# Patient Record
Sex: Male | Born: 1965 | Race: Black or African American | Hispanic: No | Marital: Married | Smoking: Former smoker
Health system: Southern US, Community
[De-identification: ages and names within clinical notes are randomized; demographics above are authoritative.]

## PROBLEM LIST (undated history)

## (undated) DIAGNOSIS — R079 Chest pain, unspecified: Secondary | ICD-10-CM

## (undated) DIAGNOSIS — R0602 Shortness of breath: Secondary | ICD-10-CM

## (undated) DIAGNOSIS — R0600 Dyspnea, unspecified: Secondary | ICD-10-CM

## (undated) DIAGNOSIS — I2510 Atherosclerotic heart disease of native coronary artery without angina pectoris: Secondary | ICD-10-CM

## (undated) DIAGNOSIS — I10 Essential (primary) hypertension: Secondary | ICD-10-CM

## (undated) DIAGNOSIS — R0609 Other forms of dyspnea: Secondary | ICD-10-CM

## (undated) HISTORY — DX: Shortness of breath: R06.02

## (undated) HISTORY — PX: NO PAST SURGERIES: SHX2092

## (undated) HISTORY — DX: Essential (primary) hypertension: I10

## (undated) HISTORY — DX: Dyspnea, unspecified: R06.00

## (undated) HISTORY — DX: Other forms of dyspnea: R06.09

## (undated) HISTORY — DX: Chest pain, unspecified: R07.9

---

## 2020-05-07 ENCOUNTER — Emergency Department (HOSPITAL_COMMUNITY)
Admission: EM | Admit: 2020-05-07 | Discharge: 2020-05-08 | Disposition: A | Discharge status: Home / Self Care | Payer: Self-pay | Attending: Emergency Medicine | Admitting: Emergency Medicine

## 2020-05-07 ENCOUNTER — Encounter (HOSPITAL_COMMUNITY): Payer: Self-pay

## 2020-05-07 ENCOUNTER — Other Ambulatory Visit: Payer: Self-pay

## 2020-05-07 ENCOUNTER — Emergency Department (HOSPITAL_COMMUNITY): Payer: Self-pay

## 2020-05-07 DIAGNOSIS — R079 Chest pain, unspecified: Secondary | ICD-10-CM

## 2020-05-07 DIAGNOSIS — I10 Essential (primary) hypertension: Secondary | ICD-10-CM | POA: Insufficient documentation

## 2020-05-07 DIAGNOSIS — R0789 Other chest pain: Secondary | ICD-10-CM | POA: Diagnosis present

## 2020-05-07 LAB — CBC
HCT: 43.3 % (ref 39.0–52.0)
Hemoglobin: 14.2 g/dL (ref 13.0–17.0)
MCH: 29.3 pg (ref 26.0–34.0)
MCHC: 32.8 g/dL (ref 30.0–36.0)
MCV: 89.3 fL (ref 80.0–100.0)
Platelets: 285 10*3/uL (ref 150–400)
RBC: 4.85 MIL/uL (ref 4.22–5.81)
RDW: 14.2 % (ref 11.5–15.5)
WBC: 10.8 10*3/uL — ABNORMAL HIGH (ref 4.0–10.5)
nRBC: 0 % (ref 0.0–0.2)

## 2020-05-07 LAB — BASIC METABOLIC PANEL
Anion gap: 8 (ref 5–15)
BUN: 15 mg/dL (ref 6–20)
CO2: 25 mmol/L (ref 22–32)
Calcium: 9.5 mg/dL (ref 8.9–10.3)
Chloride: 105 mmol/L (ref 98–111)
Creatinine, Ser: 1.3 mg/dL — ABNORMAL HIGH (ref 0.61–1.24)
GFR, Estimated: >60 mL/min (ref >60)
Glucose, Bld: 99 mg/dL (ref 70–99)
Potassium: 4 mmol/L (ref 3.5–5.1)
Sodium: 138 mmol/L (ref 135–145)

## 2020-05-07 LAB — TROPONIN I (HIGH SENSITIVITY): Troponin I (High Sensitivity): 6 ng/L (ref <18)

## 2020-05-07 MED ORDER — ASPIRIN 81 MG PO CHEW
324.0000 mg | CHEWABLE_TABLET | Freq: Once | ORAL | Status: AC
  Administered 2020-05-08: 324 mg via ORAL
  Filled 2020-05-07: qty 4

## 2020-05-07 MED ORDER — NITROGLYCERIN 0.4 MG SL SUBL
0.4000 mg | SUBLINGUAL_TABLET | Freq: Once | SUBLINGUAL | Status: AC
  Administered 2020-05-08 (×2): 0.4 mg via SUBLINGUAL
  Filled 2020-05-07: qty 1

## 2020-05-07 NOTE — ED Provider Notes (Signed)
0- [facility]-EMERGENCY DEPT Provider Note   CSN: [id number] Arrival date & time: 05/07/20  2202     History Chief Complaint  Patient presents with  . Chest Pain    Jesse Wells is a 55 y.o. male.  HPI He presents for evaluation of chest discomfort, bilateral anterior, that is felt as a "burning," discomfort that is fairly constant over the last 2 weeks.  He also has associated dyspnea on exertion but no orthopnea.  Today he was walking while at work, and noticed that he was sweating.  He did notice the chest discomfort at that time as well.  He has not had this syndrome previously.  He does not take medications for blood pressure.  He does not know his cholesterol level.  He works a job as a Naval architect for [employer].  He denies doing heavy lifting, or exertion, while on the job.  There are no other known modifying factors    History reviewed. No pertinent past medical history.  There are no problems to display for this patient.   History reviewed. No pertinent surgical history.     History reviewed. No pertinent family history.     Home Medications Prior to Admission medications   Not on File    Allergies    Patient has no known allergies.  Review of Systems   Review of Systems  All other systems reviewed and are negative.   Physical Exam Updated Vital Signs BP (!) 173/101   Pulse 75   Temp 98.2 F (36.8 C) (Oral)   Resp 14   SpO2 100%   Physical Exam Vitals and nursing note reviewed.  Constitutional:      General: He is not in acute distress.    Appearance: He is well-developed. He is not ill-appearing, toxic-appearing or diaphoretic.  HENT:     Head: Normocephalic and atraumatic.     Right Ear: External ear normal.     Left Ear: External ear normal.     Nose: No congestion or rhinorrhea.     Mouth/Throat:     Pharynx: No oropharyngeal exudate or posterior oropharyngeal erythema.  Eyes:     Conjunctiva/sclera:  Conjunctivae normal.     Pupils: Pupils are equal, round, and reactive to light.  Neck:     Trachea: Phonation normal.  Cardiovascular:     Rate and Rhythm: Normal rate and regular rhythm.     Heart sounds: Normal heart sounds.  Pulmonary:     Effort: Pulmonary effort is normal.     Breath sounds: Normal breath sounds.  Abdominal:     General: There is no distension.     Palpations: Abdomen is soft.     Tenderness: There is no abdominal tenderness.  Musculoskeletal:        General: Normal range of motion.     Cervical back: Normal range of motion and neck supple.  Skin:    General: Skin is warm and dry.  Neurological:     Mental Status: He is alert and oriented to person, place, and time.     Cranial Nerves: No cranial nerve deficit.     Sensory: No sensory deficit.     Motor: No abnormal muscle tone.     Coordination: Coordination normal.  Psychiatric:        Mood and Affect: Mood normal.        Behavior: Behavior normal.        Thought Content: Thought content normal.  Judgment: Judgment normal.     ED Results / Procedures / Treatments   Labs (all labs ordered are listed, but only abnormal results are displayed) Labs Reviewed  CBC - Abnormal; Notable for the following components:      Result Value   WBC 10.8 (*)    All other components within normal limits  BASIC METABOLIC PANEL  TROPONIN I (HIGH SENSITIVITY)    EKG EKG Interpretation  Date/Time:  Wednesday May 07 2020 22:10:07 EDT Ventricular Rate:  76 PR Interval:  133 QRS Duration: 100 QT Interval:  372 QTC Calculation: 419 R Axis:   30 Text Interpretation: Sinus rhythm No old tracing to compare Confirmed by Mancel Bale [phone]) on 05/07/2020 10:21:57 PM   Radiology DG Chest 2 View  Result Date: 05/07/2020 CLINICAL DATA:  Mid chest pain for 2 weeks. Shortness of breath tonight. EXAM: CHEST - 2 VIEW COMPARISON:  None. FINDINGS: The cardiomediastinal contours are normal. Mild bronchial  thickening. Pulmonary vasculature is normal. No consolidation, pleural effusion, or pneumothorax. No acute osseous abnormalities are seen. IMPRESSION: Mild bronchial thickening which can be seen with asthma or bronchitis. Electronically Signed   By: Narda Rutherford M.D.   On: 05/07/2020 22:31    Procedures Procedures   Medications Ordered in ED Medications - No data to display  ED Course  I have reviewed the triage vital signs and the nursing notes.  Pertinent labs & imaging results that were available during my care of the patient were reviewed by me and considered in my medical decision making (see chart for details).    MDM Rules/Calculators/A&P                           Patient Vitals for the past 24 hrs:  BP Temp Temp src Pulse Resp SpO2  05/07/20 2215 (!) 173/101 -- -- 75 14 100 %  05/07/20 2210 (!) 166/95 98.2 F (36.8 C) Oral 73 18 100 %     Medical Decision Making:  This patient is presenting for evaluation of chest pain, which does require a range of treatment options, and is a complaint that involves a moderate risk of morbidity and mortality. The differential diagnoses include ACS, pneumonia, gastrointestinal distress, reflux, nonspecific pain. I decided to review old records, and in summary middle-aged male presenting for evaluation of chest pain shortness of breath on exertion.  He is not having no medical problems and no prior similar symptoms.  Patient is a smoker. I did not require additional historical information from anyone.  Clinical Laboratory Tests Ordered, included CBC, Metabolic panel and Troponin. Review indicates normal except white count slightly elevated,. Radiologic Tests Ordered, included chest x-ray.  I independently Visualized: Radiologic images, which show no infiltrate or edema, possible bronchitis  Cardiac Monitor Tracing which shows normal sinus rhythm   Critical Interventions-clinical evaluation, laboratory testing, chest x-ray,  observation and reassessment  After These Interventions, the Patient was reevaluated and was found with nonspecified chest discomfort, which he describes as burning pain.  He did have associated shortness of breath and dyspnea on exertion, while he was walking today at work.  He is a cigarette smoker.  He does not have symptoms of bronchitis.  Does not appear fluid overloaded.  CRITICAL CARE-no Performed by: Mancel Bale  Nursing Notes Reviewed/ Care Coordinated Applicable Imaging Reviewed Interpretation of Laboratory Data incorporated into ED treatment  Plan:-Disposition by oncoming provider following return of testing.  Patient does not have a PCP.  He has mild hypertension.  He will need referral to PCP or cardiology for ongoing management.  Final Clinical Impression(s) / ED Diagnoses Final diagnoses:  Nonspecific chest pain  Hypertension, unspecified type    Rx / DC Orders ED Discharge Orders    None       Mancel Bale, MD 05/07/20 2257

## 2020-05-07 NOTE — ED Triage Notes (Signed)
Pt c/o intermittent chest pain x2 weeks. Pt states movement makes it worse. Pt states he has had episodes of diaphoresis and weakness with chest pain. Pt states pain is 4/10 at this moment.

## 2020-05-08 LAB — TROPONIN I (HIGH SENSITIVITY): Troponin I (High Sensitivity): 6 ng/L (ref <18)

## 2020-05-08 MED ORDER — HYDROCHLOROTHIAZIDE 25 MG PO TABS: 25.0000 mg | ORAL_TABLET | Freq: Every day | ORAL | 0 refills | Status: DC

## 2020-05-08 MED ORDER — ASPIRIN 81 MG PO CHEW: 81.0000 mg | CHEWABLE_TABLET | Freq: Every day | ORAL | Status: AC

## 2020-05-08 NOTE — ED Notes (Addendum)
Pt reports no change in chest pain after first nitro administration

## 2020-05-08 NOTE — Discharge Instructions (Addendum)
Please pick up your blood pressure medication at your pharmacy. Take a baby aspirin daily.  Follow up with cardiology for stress testing to assess for angina. Establish care with a primary care provider.  Return to the ED if your chest pain persists and is not relieved with rest or worsens.

## 2020-05-08 NOTE — ED Provider Notes (Signed)
I assumed care of this patient.  Please see previous provider note for further details of Hx, PE.  Briefly patient is a 55 y.o. male who presented intermittent chest pain with exertion. HEART 4. EKG without acute ischemic changes or evidence of pericarditis.  Initial troponin negative.  Labs reassuring without anemia.  No significant electrolyte derangements.  Mild renal sufficiency noted.  Repeat EKG after walking briskly down the hall numerous times without ischemic changes Delta troponin negative and flat.  Given ASA. No improvement with NTG.  Will refer patient to cardiology for outpatient evaluation and management.  The patient appears reasonably screened and/or stabilized for discharge and I doubt any other medical condition or other [facility] requiring further screening, evaluation, or treatment in the ED at this time prior to discharge. Safe for discharge with strict return precautions.  Disposition: Discharge  Condition: Good  I have discussed the results, Dx and Tx plan with the patient/family who expressed understanding and agree(s) with the plan. Discharge instructions discussed at length. The patient/family was given strict return precautions who verbalized understanding of the instructions. No further questions at time of discharge.    ED Discharge Orders         Ordered    hydrochlorothiazide (HYDRODIURIL) 25 MG tablet  Daily        05/08/20 0204    Ambulatory referral to Cardiology        05/08/20 0204    aspirin 81 MG chewable tablet  Daily        05/08/20 0204           Follow Up: [facility] [address] [place] [address] [phone] Call  to schedule an appointment for close follow up for stress testing  Primary care provider  Schedule an appointment as soon as possible for a visit  if you do not have a primary care physician, contact [facility] at [phone] for  referral         Nira Conn, MD 05/08/20 0205

## 2020-05-27 ENCOUNTER — Observation Stay (HOSPITAL_COMMUNITY)
Admission: EM | Admit: 2020-05-27 | Discharge: 2020-05-29 | Disposition: A | Discharge status: Home / Self Care | Payer: 59 | Attending: Internal Medicine | Admitting: Internal Medicine

## 2020-05-27 ENCOUNTER — Emergency Department (HOSPITAL_COMMUNITY): Payer: 59

## 2020-05-27 ENCOUNTER — Other Ambulatory Visit: Payer: Self-pay

## 2020-05-27 DIAGNOSIS — M25532 Pain in left wrist: Secondary | ICD-10-CM | POA: Insufficient documentation

## 2020-05-27 DIAGNOSIS — R079 Chest pain, unspecified: Secondary | ICD-10-CM | POA: Diagnosis present

## 2020-05-27 DIAGNOSIS — I25110 Atherosclerotic heart disease of native coronary artery with unstable angina pectoris: Principal | ICD-10-CM

## 2020-05-27 DIAGNOSIS — I10 Essential (primary) hypertension: Secondary | ICD-10-CM | POA: Diagnosis not present

## 2020-05-27 DIAGNOSIS — Z20822 Contact with and (suspected) exposure to covid-19: Secondary | ICD-10-CM | POA: Insufficient documentation

## 2020-05-27 DIAGNOSIS — Z7982 Long term (current) use of aspirin: Secondary | ICD-10-CM | POA: Insufficient documentation

## 2020-05-27 DIAGNOSIS — Z79899 Other long term (current) drug therapy: Secondary | ICD-10-CM | POA: Insufficient documentation

## 2020-05-27 DIAGNOSIS — R072 Precordial pain: Secondary | ICD-10-CM

## 2020-05-27 DIAGNOSIS — Z955 Presence of coronary angioplasty implant and graft: Secondary | ICD-10-CM

## 2020-05-27 DIAGNOSIS — F17210 Nicotine dependence, cigarettes, uncomplicated: Secondary | ICD-10-CM | POA: Diagnosis not present

## 2020-05-27 LAB — COMPREHENSIVE METABOLIC PANEL
ALT: 19 U/L (ref 0–44)
AST: 20 U/L (ref 15–41)
Albumin: 3.8 g/dL (ref 3.5–5.0)
Alkaline Phosphatase: 57 U/L (ref 38–126)
Anion gap: 5 (ref 5–15)
BUN: 12 mg/dL (ref 6–20)
CO2: 29 mmol/L (ref 22–32)
Calcium: 9.4 mg/dL (ref 8.9–10.3)
Chloride: 102 mmol/L (ref 98–111)
Creatinine, Ser: 1.19 mg/dL (ref 0.61–1.24)
GFR, Estimated: >60 mL/min (ref >60)
Glucose, Bld: 99 mg/dL (ref 70–99)
Potassium: 4 mmol/L (ref 3.5–5.1)
Sodium: 136 mmol/L (ref 135–145)
Total Bilirubin: 0.6 mg/dL (ref 0.3–1.2)
Total Protein: 7.2 g/dL (ref 6.5–8.1)

## 2020-05-27 LAB — CBC WITH DIFFERENTIAL/PLATELET
Abs Immature Granulocytes: 0.03 10*3/uL (ref 0.00–0.07)
Basophils Absolute: 0.1 10*3/uL (ref 0.0–0.1)
Basophils Relative: 1 %
Eosinophils Absolute: 0.3 10*3/uL (ref 0.0–0.5)
Eosinophils Relative: 4 %
HCT: 44.8 % (ref 39.0–52.0)
Hemoglobin: 14.7 g/dL (ref 13.0–17.0)
Immature Granulocytes: 0 %
Lymphocytes Relative: 28 %
Lymphs Abs: 2.4 10*3/uL (ref 0.7–4.0)
MCH: 29.1 pg (ref 26.0–34.0)
MCHC: 32.8 g/dL (ref 30.0–36.0)
MCV: 88.7 fL (ref 80.0–100.0)
Monocytes Absolute: 0.8 10*3/uL (ref 0.1–1.0)
Monocytes Relative: 9 %
Neutro Abs: 5.1 10*3/uL (ref 1.7–7.7)
Neutrophils Relative %: 58 %
Platelets: 289 10*3/uL (ref 150–400)
RBC: 5.05 MIL/uL (ref 4.22–5.81)
RDW: 13.8 % (ref 11.5–15.5)
WBC: 8.7 10*3/uL (ref 4.0–10.5)
nRBC: 0 % (ref 0.0–0.2)

## 2020-05-27 LAB — TROPONIN I (HIGH SENSITIVITY)
Troponin I (High Sensitivity): 6 ng/L (ref <18)
Troponin I (High Sensitivity): 6 ng/L (ref <18)

## 2020-05-27 LAB — LIPASE, BLOOD: Lipase: 34 U/L (ref 11–51)

## 2020-05-27 MED ORDER — NITROGLYCERIN 2 % TD OINT
1.0000 [in_us] | TOPICAL_OINTMENT | Freq: Four times a day (QID) | TRANSDERMAL | Status: DC
  Administered 2020-05-27: 1 [in_us] via TOPICAL
  Filled 2020-05-27: qty 1

## 2020-05-27 MED ORDER — OXYCODONE-ACETAMINOPHEN 5-325 MG PO TABS: 1.0000 | ORAL_TABLET | Freq: Four times a day (QID) | ORAL | 0 refills | Status: DC | PRN

## 2020-05-27 MED ORDER — FAMOTIDINE 20 MG PO TABS
40.0000 mg | ORAL_TABLET | Freq: Once | ORAL | Status: AC
  Administered 2020-05-27: 40 mg via ORAL
  Filled 2020-05-27: qty 2

## 2020-05-27 MED ORDER — ONDANSETRON HCL 4 MG/2ML IJ SOLN
4.0000 mg | Freq: Four times a day (QID) | INTRAMUSCULAR | Status: DC | PRN
Start: 2020-05-27 — End: 2020-05-29

## 2020-05-27 MED ORDER — ACETAMINOPHEN 325 MG PO TABS: 650.0000 mg | ORAL_TABLET | ORAL | Status: DC | PRN

## 2020-05-27 MED ORDER — ASPIRIN 81 MG PO CHEW
81.0000 mg | CHEWABLE_TABLET | Freq: Every day | ORAL | Status: DC
  Administered 2020-05-28 – 2020-05-29 (×2): 81 mg via ORAL
  Filled 2020-05-27 (×2): qty 1

## 2020-05-27 MED ORDER — NAPROXEN 375 MG PO TABS: 375.0000 mg | ORAL_TABLET | Freq: Two times a day (BID) | ORAL | 0 refills | Status: DC

## 2020-05-27 MED ORDER — ASPIRIN 81 MG PO CHEW
324.0000 mg | CHEWABLE_TABLET | Freq: Once | ORAL | Status: AC
  Administered 2020-05-27: 324 mg via ORAL
  Filled 2020-05-27: qty 4

## 2020-05-27 MED ORDER — HYDROCHLOROTHIAZIDE 25 MG PO TABS: 25.0000 mg | ORAL_TABLET | Freq: Every day | ORAL | Status: DC

## 2020-05-27 MED ORDER — ENOXAPARIN SODIUM 40 MG/0.4ML IJ SOSY
40.0000 mg | PREFILLED_SYRINGE | INTRAMUSCULAR | Status: DC
  Administered 2020-05-28 (×2): 40 mg via SUBCUTANEOUS
  Filled 2020-05-27 (×2): qty 0.4

## 2020-05-27 NOTE — ED Provider Notes (Deleted)
[facility] EMERGENCY DEPARTMENT Provider Note   CSN: [id number] Arrival date & time: 05/27/20  1456     History Chief Complaint  Patient presents with  . Chest Pain    Jesse Wells is a 55 y.o. male.  HPI Patient has had problems with her left wrist and is seen by the hand specialist Dr. Maisie Fus.  She reports she is had a prior surgery with plating done in the wrist.  They have been monitoring this and evaluating it at the hand specialist.  She reports she had an MRI and there were no acute findings identified.  Because she was having ongoing problems with pain, a hard cast was applied 5 days ago.  She reports yesterday it started to get really painful and this morning it was excruciatingly painful in the cast.  She went to the office and they remove the cast.  She reports she wanted to go home and just try Tylenol for pain control.  She reports it was not effective at all and she is in severe pain.  She reports he had a lot of pain along her thumb in her palm.  Sometimes the fingers feel little tingly.  She reports the wrist is extremely stiff and painful to move now.  She reports previously before going into the cast there was pain but it was not stiff like it is now.    Past Medical History:  Diagnosis Date  . Chest pain   . Hypertension     There are no problems to display for this patient.   Past Surgical History:  Procedure Laterality Date  . NO PAST SURGERIES         No family history on file.     Home Medications Prior to Admission medications   Medication Sig Start Date End Date Taking? Authorizing Provider  naproxen (NAPROSYN) 375 MG tablet Take 1 tablet (375 mg total) by mouth 2 (two) times daily. 05/27/20  Yes Arby Barrette, MD  oxyCODONE-acetaminophen (PERCOCET) 5-325 MG tablet Take 1-2 tablets by mouth every 6 (six) hours as needed for moderate pain. 05/27/20  Yes Arby Barrette, MD  aspirin 81 MG chewable tablet Chew 1 tablet (81 mg  total) by mouth daily. 05/08/20   Nira Conn, MD  hydrochlorothiazide (HYDRODIURIL) 25 MG tablet Take 1 tablet (25 mg total) by mouth daily. 05/08/20   Nira Conn, MD    Allergies    Patient has no known allergies.  Review of Systems   Review of Systems Constitutional: No fevers no chills Aspiratory: No cough no shortness of breath Physical Exam Updated Vital Signs BP (!) 154/98   Pulse 62   Temp 97.8 F (36.6 C) (Oral)   Resp 20   SpO2 100%   Physical Exam Constitutional:      Comments: Alert nontoxic.  Mental status clear.  No respiratory distress.  Patient appears very uncomfortable.  Eyes:     Extraocular Movements: Extraocular movements intact.  Pulmonary:     Effort: Pulmonary effort is normal.  Musculoskeletal:     Comments: The left wrist is not swollen or erythematous.  Patient has a very well-healed scar on the volar forearm.  He is holding the fingers slightly flexed.  It is painful for her to extend them completely.  There is no redness or swelling to the fingers palmar hand.  Pulses 2+ and strong.  Cap refill less than 2 seconds.  No swelling of the forearm or the elbow.  Skin:  General: Skin is warm and dry.  Neurological:     General: No focal deficit present.     Mental Status: He is oriented to person, place, and time.     Coordination: Coordination normal.     ED Results / Procedures / Treatments   Labs (all labs ordered are listed, but only abnormal results are displayed) Labs Reviewed  COMPREHENSIVE METABOLIC PANEL  LIPASE, BLOOD  CBC WITH DIFFERENTIAL/PLATELET  TROPONIN I (HIGH SENSITIVITY)  TROPONIN I (HIGH SENSITIVITY)    EKG EKG Interpretation  Date/Time:  Tuesday May 27 2020 15:33:28 EDT Ventricular Rate:  68 PR Interval:  138 QRS Duration: 92 QT Interval:  384 QTC Calculation: 408 R Axis:   -12 Text Interpretation: Normal sinus rhythm Normal ECG normal, no sig changefrom previous Confirmed by Arby Barrette [phone]) on 05/27/2020 9:05:05 PM   Radiology DG Chest 2 View  Result Date: 05/27/2020 CLINICAL DATA:  Chest pain EXAM: CHEST - 2 VIEW COMPARISON:  05/07/2020 FINDINGS: Mild cardiomegaly. No focal opacity, pleural effusion or pneumothorax. IMPRESSION: No active cardiopulmonary disease. Electronically Signed   By: Jasmine Pang M.D.   On: 05/27/2020 16:16    Procedures Procedures   Medications Ordered in ED Medications - No data to display  ED Course  I have reviewed the triage vital signs and the nursing notes.  Pertinent labs & imaging results that were available during my care of the patient were reviewed by me and considered in my medical decision making (see chart for details).    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  Is being managed by hand surgery.  She had a recent MRI and no signs of any acute findings.  Today the wrist does not appear infected.  There is no erythema or swelling.  The pain dramatically escalated after being in a hard splint.  At this time I suspect this is neuralgia.  Patient now is very reluctant to move the wrist due to exacerbation of pain.  I do not think that there is any additional imaging tonight that will be helpful.  Plan will be to assist with pain control.  Patient request Toradol.  We will give a Toradol shot and 2 Percocet tablets.  Patient will continue to follow-up with a hand specialist. Final Clinical Impression(s) / ED Diagnoses Final diagnoses:  Neuralgia  Left wrist pain    Rx / DC Orders ED Discharge Orders         Ordered    naproxen (NAPROSYN) 375 MG tablet  2 times daily        05/27/20 2123    oxyCODONE-acetaminophen (PERCOCET) 5-325 MG tablet  Every 6 hours PRN        05/27/20 2123           Arby Barrette, MD 05/27/20 2123

## 2020-05-27 NOTE — ED Provider Notes (Signed)
Emergency Medicine Provider Triage Evaluation Note  Jesse Wells , a 55 y.o. male  was evaluated in triage.  Pt complains of chest pain for over a month, he says it is a little worse today and he had a little tingling iin his right arm and got sweaty.  He has a appointment with cardiology but not until June 1st. .  Review of Systems  Positive: Chest pain, right arm tingling, shob when pain is bad Negative: fever  Physical Exam  BP (!) 148/90 (BP Location: Right Arm)   Pulse 66   Temp 98.1 F (36.7 C) (Oral)   Resp 19   SpO2 100%  Gen:   Awake, no distress   Resp:  Normal effort  MSK:   Moves extremities without difficulty  Other:  RRR, no [facility]  Medical Decision Making  Medically screening exam initiated at 3:44 PM.  Appropriate orders placed.  Carlson Belland was informed that the remainder of the evaluation will be completed by another provider, this initial triage assessment does not replace that evaluation, and the importance of remaining in the ED until their evaluation is complete.     Cristina Gong, PA-C 05/27/20 1547    Little, Ambrose Finland, MD 05/30/20 [phone]

## 2020-05-27 NOTE — ED Provider Notes (Incomplete Revision)
[facility] EMERGENCY DEPARTMENT Provider Note   CSN: [id number] Arrival date & time: 05/27/20  1456     History Chief Complaint  Patient presents with  . Chest Pain    Jesse Wells is a 55 y.o. male.  HPI   Past Medical History:  Diagnosis Date  . Chest pain   . Hypertension     There are no problems to display for this patient.   Past Surgical History:  Procedure Laterality Date  . NO PAST SURGERIES         No family history on file.     Home Medications Prior to Admission medications   Medication Sig Start Date End Date Taking? Authorizing Provider  aspirin 81 MG chewable tablet Chew 1 tablet (81 mg total) by mouth daily. 05/08/20   Nira Conn, MD  hydrochlorothiazide (HYDRODIURIL) 25 MG tablet Take 1 tablet (25 mg total) by mouth daily. 05/08/20   Nira Conn, MD    Allergies    Patient has no known allergies.  Review of Systems   Review of Systems  Physical Exam Updated Vital Signs BP 138/85   Pulse 62   Temp 97.8 F (36.6 C) (Oral)   Resp 16   SpO2 99%    ED Results / Procedures / Treatments   Labs (all labs ordered are listed, but only abnormal results are displayed) Labs Reviewed  COMPREHENSIVE METABOLIC PANEL  LIPASE, BLOOD  CBC WITH DIFFERENTIAL/PLATELET  TROPONIN I (HIGH SENSITIVITY)  TROPONIN I (HIGH SENSITIVITY)    EKG EKG Interpretation  Date/Time:  Tuesday May 27 2020 15:33:28 EDT Ventricular Rate:  68 PR Interval:  138 QRS Duration: 92 QT Interval:  384 QTC Calculation: 408 R Axis:   -12 Text Interpretation: Normal sinus rhythm Normal ECG normal, no sig changefrom previous Confirmed by Arby Barrette [phone]) on 05/27/2020 9:05:05 PM   Radiology DG Chest 2 View  Result Date: 05/27/2020 CLINICAL DATA:  Chest pain EXAM: CHEST - 2 VIEW COMPARISON:  05/07/2020 FINDINGS: Mild cardiomegaly. No focal opacity, pleural effusion or pneumothorax. IMPRESSION: No active cardiopulmonary  disease. Electronically Signed   By: Jasmine Pang M.D.   On: 05/27/2020 16:16    Procedures Procedures   Medications Ordered in ED Medications  aspirin chewable tablet 324 mg (has no administration in time range)  nitroGLYCERIN (NITROGLYN) 2 % ointment 1 inch (has no administration in time range)  famotidine (PEPCID) tablet 40 mg (has no administration in time range)    ED Course  I have reviewed the triage vital signs and the nursing notes.  Pertinent labs & imaging results that were available during my care of the patient were reviewed by me and considered in my medical decision making (see chart for details).  Clinical Course as of 05/27/20 2316  Tue May 27, 2020  2311 Consult: Reviewed with [facility] cardiology Dr. Thelma Barge.  Recommends admission to medical service and stress test in a.m. [MP]    Clinical Course User Index [MP] Arby Barrette, MD   MDM Rules/Calculators/A&P                           Final Clinical Impression(s) / ED Diagnoses Final diagnoses:  Precordial pain  Exertional chest pain    Rx / DC Orders ED Discharge Orders         Ordered    naproxen (NAPROSYN) 375 MG tablet  2 times daily,   Status:  Discontinued  05/27/20 2123    oxyCODONE-acetaminophen (PERCOCET) 5-325 MG tablet  Every 6 hours PRN,   Status:  Discontinued        05/27/20 2123           Arby Barrette, MD 05/27/20 2123

## 2020-05-27 NOTE — ED Triage Notes (Addendum)
Pt reports intermittent chest pain x 1 month. Pt report pain is brought on in the morning where he becomes diaphoretic, and SOB. Pt states was seen here a couple of weeks ago and was referred to an cardiologist however pt reports his appointment is not until June and states worsening chest pain today

## 2020-05-28 ENCOUNTER — Encounter (HOSPITAL_COMMUNITY): Payer: Self-pay | Admitting: Internal Medicine

## 2020-05-28 ENCOUNTER — Encounter (HOSPITAL_COMMUNITY)
Admission: EM | Disposition: A | Discharge status: Home / Self Care | Payer: Self-pay | Source: Home / Self Care | Attending: Emergency Medicine

## 2020-05-28 ENCOUNTER — Observation Stay (HOSPITAL_BASED_OUTPATIENT_CLINIC_OR_DEPARTMENT_OTHER): Payer: 59

## 2020-05-28 DIAGNOSIS — R079 Chest pain, unspecified: Secondary | ICD-10-CM

## 2020-05-28 DIAGNOSIS — I25110 Atherosclerotic heart disease of native coronary artery with unstable angina pectoris: Secondary | ICD-10-CM

## 2020-05-28 DIAGNOSIS — I10 Essential (primary) hypertension: Secondary | ICD-10-CM | POA: Diagnosis present

## 2020-05-28 HISTORY — PX: CORONARY STENT INTERVENTION: CATH118234

## 2020-05-28 HISTORY — PX: LEFT HEART CATH AND CORONARY ANGIOGRAPHY: CATH118249

## 2020-05-28 LAB — ECHOCARDIOGRAM COMPLETE
AR max vel: 2.76 cm2
AV Area VTI: 2.81 cm2
AV Area mean vel: 2.82 cm2
AV Mean grad: 3 mmHg
AV Peak grad: 5.1 mmHg
Ao pk vel: 1.13 m/s
Area-P 1/2: 4.89 cm2
Height: 64 in
S' Lateral: 3.2 cm
Weight: 3463.87 oz

## 2020-05-28 LAB — LIPID PANEL
Cholesterol: 153 mg/dL (ref 0–200)
HDL: 40 mg/dL — ABNORMAL LOW (ref >40)
LDL Cholesterol: 100 mg/dL — ABNORMAL HIGH (ref 0–99)
Total CHOL/HDL Ratio: 3.8 RATIO
Triglycerides: 65 mg/dL (ref <150)
VLDL: 13 mg/dL (ref 0–40)

## 2020-05-28 LAB — HEMOGLOBIN A1C
Hgb A1c MFr Bld: 5.6 % (ref 4.8–5.6)
Mean Plasma Glucose: 114.02 mg/dL

## 2020-05-28 LAB — RESP PANEL BY RT-PCR (FLU A&B, COVID) ARPGX2
Influenza A by PCR: NEGATIVE
Influenza B by PCR: NEGATIVE
SARS Coronavirus 2 by RT PCR: NEGATIVE

## 2020-05-28 LAB — POCT ACTIVATED CLOTTING TIME: Activated Clotting Time: 339 seconds

## 2020-05-28 LAB — HIV ANTIBODY (ROUTINE TESTING W REFLEX): HIV Screen 4th Generation wRfx: NONREACTIVE

## 2020-05-28 LAB — MRSA PCR SCREENING: MRSA by PCR: POSITIVE — AB

## 2020-05-28 SURGERY — LEFT HEART CATH AND CORONARY ANGIOGRAPHY
Anesthesia: LOCAL

## 2020-05-28 MED ORDER — SODIUM CHLORIDE 0.9% FLUSH: 3.0000 mL | INTRAVENOUS | Status: DC | PRN

## 2020-05-28 MED ORDER — LOSARTAN POTASSIUM 25 MG PO TABS
25.0000 mg | ORAL_TABLET | Freq: Every day | ORAL | Status: DC
  Administered 2020-05-29: 25 mg via ORAL
  Filled 2020-05-28: qty 1

## 2020-05-28 MED ORDER — IOHEXOL 350 MG/ML SOLN
INTRAVENOUS | Status: DC | PRN
  Administered 2020-05-28: 60 mL

## 2020-05-28 MED ORDER — MIDAZOLAM HCL 2 MG/2ML IJ SOLN
INTRAMUSCULAR | Status: DC | PRN
Start: 2020-05-28 — End: 2020-05-28
  Administered 2020-05-28: 2 mg via INTRAVENOUS
  Administered 2020-05-28: 1 mg via INTRAVENOUS

## 2020-05-28 MED ORDER — MUPIROCIN 2 % EX OINT
1.0000 "application " | TOPICAL_OINTMENT | Freq: Two times a day (BID) | CUTANEOUS | Status: DC
  Administered 2020-05-28 – 2020-05-29 (×3): 1 via NASAL
  Filled 2020-05-28: qty 22

## 2020-05-28 MED ORDER — METOPROLOL SUCCINATE ER 25 MG PO TB24
12.5000 mg | ORAL_TABLET | Freq: Every day | ORAL | Status: DC
  Administered 2020-05-29: 12.5 mg via ORAL
  Filled 2020-05-28: qty 1

## 2020-05-28 MED ORDER — HEPARIN SODIUM (PORCINE) 1000 UNIT/ML IJ SOLN
INTRAMUSCULAR | Status: DC | PRN
  Administered 2020-05-28 (×2): 5000 [IU] via INTRAVENOUS

## 2020-05-28 MED ORDER — MIDAZOLAM HCL 2 MG/2ML IJ SOLN
INTRAMUSCULAR | Status: AC
  Filled 2020-05-28: qty 2

## 2020-05-28 MED ORDER — CHLORHEXIDINE GLUCONATE CLOTH 2 % EX PADS
6.0000 | MEDICATED_PAD | Freq: Every day | CUTANEOUS | Status: DC
  Administered 2020-05-28 – 2020-05-29 (×2): 6 via TOPICAL

## 2020-05-28 MED ORDER — ROSUVASTATIN CALCIUM 20 MG PO TABS
20.0000 mg | ORAL_TABLET | Freq: Every day | ORAL | Status: DC
  Administered 2020-05-29: 20 mg via ORAL
  Filled 2020-05-28: qty 1

## 2020-05-28 MED ORDER — ASPIRIN 81 MG PO CHEW: 81.0000 mg | CHEWABLE_TABLET | ORAL | Status: DC

## 2020-05-28 MED ORDER — LABETALOL HCL 5 MG/ML IV SOLN: 10.0000 mg | INTRAVENOUS | Status: AC | PRN

## 2020-05-28 MED ORDER — SODIUM CHLORIDE 0.9 % WEIGHT BASED INFUSION
1.0000 mL/kg/h | INTRAVENOUS | Status: AC
  Administered 2020-05-29: 1 mL/kg/h via INTRAVENOUS

## 2020-05-28 MED ORDER — CLOPIDOGREL BISULFATE 300 MG PO TABS
ORAL_TABLET | ORAL | Status: AC
  Filled 2020-05-28: qty 1

## 2020-05-28 MED ORDER — NITROGLYCERIN 2 % TD OINT
1.0000 [in_us] | TOPICAL_OINTMENT | Freq: Four times a day (QID) | TRANSDERMAL | Status: DC
  Administered 2020-05-28 – 2020-05-29 (×4): 1 [in_us] via TOPICAL
  Filled 2020-05-28: qty 30

## 2020-05-28 MED ORDER — FENTANYL CITRATE (PF) 100 MCG/2ML IJ SOLN
INTRAMUSCULAR | Status: AC
  Filled 2020-05-28: qty 2

## 2020-05-28 MED ORDER — VERAPAMIL HCL 2.5 MG/ML IV SOLN
INTRAVENOUS | Status: AC
  Filled 2020-05-28: qty 2

## 2020-05-28 MED ORDER — NITROGLYCERIN 1 MG/10 ML FOR IR/CATH LAB
INTRA_ARTERIAL | Status: DC | PRN
  Administered 2020-05-28: 150 ug via INTRACORONARY

## 2020-05-28 MED ORDER — FAMOTIDINE IN NACL 20-0.9 MG/50ML-% IV SOLN
INTRAVENOUS | Status: AC | PRN
  Administered 2020-05-28: 20 mg via INTRAVENOUS

## 2020-05-28 MED ORDER — SODIUM CHLORIDE 0.9 % IV SOLN: 250.0000 mL | INTRAVENOUS | Status: DC | PRN

## 2020-05-28 MED ORDER — NICOTINE 14 MG/24HR TD PT24
14.0000 mg | MEDICATED_PATCH | Freq: Every day | TRANSDERMAL | Status: DC
  Administered 2020-05-28 – 2020-05-29 (×2): 14 mg via TRANSDERMAL
  Filled 2020-05-28 (×2): qty 1

## 2020-05-28 MED ORDER — FAMOTIDINE IN NACL 20-0.9 MG/50ML-% IV SOLN
INTRAVENOUS | Status: AC
  Filled 2020-05-28: qty 50

## 2020-05-28 MED ORDER — SODIUM CHLORIDE 0.9% FLUSH
3.0000 mL | Freq: Two times a day (BID) | INTRAVENOUS | Status: DC
  Administered 2020-05-29 (×2): 3 mL via INTRAVENOUS

## 2020-05-28 MED ORDER — HEPARIN (PORCINE) IN NACL 1000-0.9 UT/500ML-% IV SOLN
INTRAVENOUS | Status: AC
  Filled 2020-05-28: qty 1000

## 2020-05-28 MED ORDER — VERAPAMIL HCL 2.5 MG/ML IV SOLN
INTRAVENOUS | Status: DC | PRN
  Administered 2020-05-28: 10 mL via INTRA_ARTERIAL

## 2020-05-28 MED ORDER — HYDRALAZINE HCL 20 MG/ML IJ SOLN: 10.0000 mg | INTRAMUSCULAR | Status: AC | PRN

## 2020-05-28 MED ORDER — FENTANYL CITRATE (PF) 100 MCG/2ML IJ SOLN
INTRAMUSCULAR | Status: DC | PRN
  Administered 2020-05-28 (×2): 25 ug via INTRAVENOUS

## 2020-05-28 MED ORDER — HEPARIN (PORCINE) IN NACL 1000-0.9 UT/500ML-% IV SOLN
INTRAVENOUS | Status: DC | PRN
  Administered 2020-05-28 (×4): 500 mL

## 2020-05-28 MED ORDER — CLOPIDOGREL BISULFATE 75 MG PO TABS
75.0000 mg | ORAL_TABLET | Freq: Every day | ORAL | Status: DC
  Filled 2020-05-28: qty 1

## 2020-05-28 MED ORDER — CLOPIDOGREL BISULFATE 300 MG PO TABS
ORAL_TABLET | ORAL | Status: DC | PRN
  Administered 2020-05-28: 600 mg via ORAL

## 2020-05-28 MED ORDER — LIDOCAINE HCL (PF) 1 % IJ SOLN
INTRAMUSCULAR | Status: AC
  Filled 2020-05-28: qty 30

## 2020-05-28 MED ORDER — SODIUM CHLORIDE 0.9 % IV SOLN: INTRAVENOUS | Status: DC

## 2020-05-28 MED ORDER — SODIUM CHLORIDE 0.9% FLUSH
3.0000 mL | Freq: Two times a day (BID) | INTRAVENOUS | Status: DC
  Administered 2020-05-28: 3 mL via INTRAVENOUS

## 2020-05-28 MED ORDER — LIDOCAINE HCL (PF) 1 % IJ SOLN
INTRAMUSCULAR | Status: DC | PRN
  Administered 2020-05-28: 2 mL
  Administered 2020-05-28: 30 mL

## 2020-05-28 MED ORDER — NITROGLYCERIN 1 MG/10 ML FOR IR/CATH LAB
INTRA_ARTERIAL | Status: AC
  Filled 2020-05-28: qty 10

## 2020-05-28 MED ORDER — HEPARIN SODIUM (PORCINE) 1000 UNIT/ML IJ SOLN
INTRAMUSCULAR | Status: AC
  Filled 2020-05-28: qty 1

## 2020-05-28 MED ORDER — CLOPIDOGREL BISULFATE 300 MG PO TABS
ORAL_TABLET | ORAL | Status: AC
  Filled 2020-05-28: qty 2

## 2020-05-28 SURGICAL SUPPLY — 18 items
BALLN  ~~LOC~~ SAPPHIRE 4.5X15 (BALLOONS) ×1
BALLN ~~LOC~~ SAPPHIRE 4.5X15 (BALLOONS) ×1
BALLOON ~~LOC~~ SAPPHIRE 4.5X15 (BALLOONS) ×1 IMPLANT
CATH 5FR JL3.5 JR4 ANG PIG MP (CATHETERS) ×2 IMPLANT
CATH LAUNCHER 6FR EBU 3 (CATHETERS) ×2 IMPLANT
DEVICE RAD COMP TR BAND LRG (VASCULAR PRODUCTS) ×2 IMPLANT
GLIDESHEATH SLEND SS 6F .021 (SHEATH) ×2 IMPLANT
GUIDEWIRE INQWIRE 1.5J.035X260 (WIRE) ×1 IMPLANT
INQWIRE 1.5J .035X260CM (WIRE) ×2
KIT ENCORE 26 ADVANTAGE (KITS) ×2 IMPLANT
KIT HEART LEFT (KITS) ×2 IMPLANT
PACK CARDIAC CATHETERIZATION (CUSTOM PROCEDURE TRAY) ×2 IMPLANT
STENT SYNERGY XD 4.0X20 (Permanent Stent) ×1 IMPLANT
SYNERGY XD 4.0X20 (Permanent Stent) ×2 IMPLANT
SYR MEDRAD MARK 7 150ML (SYRINGE) ×2 IMPLANT
TRANSDUCER W/STOPCOCK (MISCELLANEOUS) ×4 IMPLANT
TUBING CIL FLEX 10 FLL-RA (TUBING) ×2 IMPLANT
WIRE COUGAR XT STRL 190CM (WIRE) ×2 IMPLANT

## 2020-05-28 NOTE — H&P (Signed)
History and Physical    Jesse Wells AVW:[id number] DOB: 04-09-1965 DOA: 05/27/2020  PCP: Pcp, No  Patient coming from: Home  I have personally briefly reviewed patient's old medical records in [facility]  Chief Complaint: CP  HPI: Jesse Wells is a 55 y.o. male with medical history significant of HTN.  Pt presents to ED with ~1 month h/o CP.  Increasing in frequency.  Pt describes heavy pressure on anterior portion of chest.  Seen in ED earlier this month.  CP worse with exertion.  Smokes 1/2 PPD.  Taking ASA and HCTZ for HTN.  Not helping pain.  No pain with eating.  No recent cough, fever.   ED Course: Trop neg x2.   Review of Systems: As per HPI, otherwise all review of systems negative.  Past Medical History:  Diagnosis Date  . Chest pain   . Hypertension     Past Surgical History:  Procedure Laterality Date  . NO PAST SURGERIES       reports that he has been smoking. He has been smoking about 0.50 packs per day. He does not have any smokeless tobacco history on file. He reports that he does not drink alcohol and does not use drugs.  No Known Allergies  Family History  Problem Relation Age of Onset  . Heart disease Neg Hx    Denies FHx of early onset heart attack.  Prior to Admission medications   Medication Sig Start Date End Date Taking? Authorizing Provider  aspirin 81 MG chewable tablet Chew 1 tablet (81 mg total) by mouth daily. 05/08/20  Yes Cardama, Amadeo Garnet, MD  hydrochlorothiazide (HYDRODIURIL) 25 MG tablet Take 1 tablet (25 mg total) by mouth daily. 05/08/20  Yes Nira Conn, MD    Physical Exam: Vitals:   05/27/20 1944 05/27/20 2100 05/27/20 2215 05/27/20 2330  BP: (!) 160/88 (!) 154/98 138/85 (!) 144/85  Pulse: (!) 57 62 62 (!) 59  Resp: 17 20 16 18   Temp: 97.8 F (36.6 C)     TempSrc: Oral     SpO2: 100% 100% 99% 99%    Constitutional: NAD, calm, comfortable Eyes: PERRL, lids and conjunctivae  normal ENMT: Mucous membranes are moist. Posterior pharynx clear of any exudate or lesions.Normal dentition.  Neck: normal, supple, no masses, no thyromegaly Respiratory: clear to auscultation bilaterally, no wheezing, no crackles. Normal respiratory effort. No accessory muscle use.  Cardiovascular: Regular rate and rhythm, no murmurs / rubs / gallops. No extremity edema. 2+ pedal pulses. No carotid bruits.  Abdomen: no tenderness, no masses palpated. No hepatosplenomegaly. Bowel sounds positive.  Musculoskeletal: no clubbing / cyanosis. No joint deformity upper and lower extremities. Good ROM, no contractures. Normal muscle tone.  Skin: no rashes, lesions, ulcers. No induration Neurologic: CN 2-12 grossly intact. Sensation intact, DTR normal. Strength 5/5 in all 4.  Psychiatric: Normal judgment and insight. Alert and oriented x 3. Normal mood.    Labs on Admission: I have personally reviewed following labs and imaging studies  CBC: Recent Labs  Lab 05/27/20 1553  WBC 8.7  NEUTROABS 5.1  HGB 14.7  HCT 44.8  MCV 88.7  PLT 289   Basic Metabolic Panel: Recent Labs  Lab 05/27/20 1553  NA 136  K 4.0  CL 102  CO2 29  GLUCOSE 99  BUN 12  CREATININE 1.19  CALCIUM 9.4   GFR: CrCl cannot be calculated (Unknown ideal weight.). Liver Function Tests: Recent Labs  Lab 05/27/20 1553  AST 20  ALT 19  ALKPHOS 57  BILITOT 0.6  PROT 7.2  ALBUMIN 3.8   Recent Labs  Lab 05/27/20 1553  LIPASE 34   No results for input(s): AMMONIA in the last 168 hours. Coagulation Profile: No results for input(s): INR, PROTIME in the last 168 hours. Cardiac Enzymes: No results for input(s): CKTOTAL, CKMB, CKMBINDEX, TROPONINI in the last 168 hours. BNP (last 3 results) No results for input(s): PROBNP in the last 8760 hours. HbA1C: No results for input(s): HGBA1C in the last 72 hours. CBG: No results for input(s): GLUCAP in the last 168 hours. Lipid Profile: No results for input(s): CHOL,  HDL, LDLCALC, TRIG, CHOLHDL, LDLDIRECT in the last 72 hours. Thyroid Function Tests: No results for input(s): TSH, T4TOTAL, FREET4, T3FREE, THYROIDAB in the last 72 hours. Anemia Panel: No results for input(s): VITAMINB12, FOLATE, FERRITIN, TIBC, IRON, RETICCTPCT in the last 72 hours. Urine analysis: No results found for: COLORURINE, APPEARANCEUR, LABSPEC, PHURINE, GLUCOSEU, HGBUR, BILIRUBINUR, KETONESUR, PROTEINUR, UROBILINOGEN, NITRITE, LEUKOCYTESUR  Radiological Exams on Admission: DG Chest 2 View  Result Date: 05/27/2020 CLINICAL DATA:  Chest pain EXAM: CHEST - 2 VIEW COMPARISON:  05/07/2020 FINDINGS: Mild cardiomegaly. No focal opacity, pleural effusion or pneumothorax. IMPRESSION: No active cardiopulmonary disease. Electronically Signed   By: Jasmine Pang M.D.   On: 05/27/2020 16:16    EKG: Independently reviewed.  Normal sinus rhythm  Assessment/Plan Principal Problem:   Chest pain, rule out acute myocardial infarction Active Problems:   HTN (hypertension)    1. CP r/o - 1. CP obs pathway 2. Tele monitor 3. NPO 4. EDP spoke with cards: 1. Said admit to medicine 2. Plan for stress test in AM 5. Cont ASA 2. HTN - 1. Cont HCTZ  DVT prophylaxis: Lovenox Code Status: Full Family Communication: No family in room Disposition Plan: Home after CP r/o Consults called: EDP spoke with cardiology Admission status: Place in 6   Jesse Wells M. DO [facility]  How to contact the [facility] Attending or Consulting provider 7A - 7P or covering provider during after hours 7P -7A, for this patient?  1. Check the care team in [facility] and look for a) attending/consulting [facility] provider listed and b) the [facility] team listed 2. Log into [email or web address]  Amion Physician Scheduling and messaging for groups and whole hospitals  On call and physician scheduling software for group practices, residents, hospitalists and other medical providers for call, clinic, rotation and shift schedules. OnCall  [employer] is a hospital-wide system for scheduling doctors and paging doctors on call. EasyPlot is for scientific plotting and data analysis.  [email or web address]  and use [facility]'s universal password to access. If you do not have the password, please contact the hospital operator.  3. Locate the [facility] provider you are looking for under [facility] and page to a number that you can be directly reached. 4. If you still have difficulty reaching the provider, please page the [facility] (Director on Call) for the Hospitalists listed on amion for assistance.  05/28/2020, 12:29 AM

## 2020-05-28 NOTE — Plan of Care (Signed)

## 2020-05-28 NOTE — Progress Notes (Signed)
Progress Note  Patient Name: Jesse Wells Date of Encounter: 05/29/2020  [facility] Cardiologist: None   Subjective   Underwent LHC yesterday found to have proximal LAD stenosis now s/p PCI with DES with excellent angiographic results.  Doing well this AM. Ready to go home today.  Inpatient Medications    Scheduled Meds: . aspirin  81 mg Oral Daily  . Chlorhexidine Gluconate Cloth  6 each Topical Q0600  . clopidogrel  75 mg Oral Q breakfast  . enoxaparin (LOVENOX) injection  40 mg Subcutaneous Q24H  . losartan  25 mg Oral Daily  . metoprolol succinate  12.5 mg Oral Daily  . mupirocin ointment  1 application Nasal BID  . nicotine  14 mg Transdermal Daily  . nitroGLYCERIN  1 inch Topical Q6H  . rosuvastatin  20 mg Oral Daily  . sodium chloride flush  3 mL Intravenous Q12H   Continuous Infusions: . sodium chloride     PRN Meds: sodium chloride, acetaminophen, ondansetron (ZOFRAN) IV, sodium chloride flush   Vital Signs    Vitals:   05/29/20 0112 05/29/20 0150 05/29/20 0240 05/29/20 0300  BP: 137/75 128/71 139/80 131/79  Pulse: 86 80 80 83  Resp:    18  Temp:    98.2 F (36.8 C)  TempSrc:    Oral  SpO2: 95% 98% 98% 97%  Weight:      Height:        Intake/Output Summary (Last 24 hours) at 05/29/2020 0731 Last data filed at 05/29/2020 4403 Gross per 24 hour  Intake 1674.32 ml  Output 400 ml  Net 1274.32 ml   Last 3 Weights 05/28/2020  Weight (lbs) 216 lb 7.9 oz  Weight (kg) 98.2 kg      Telemetry    NSR - Personally Reviewed  ECG    NSR with HR 68- Personally Reviewed  Physical Exam   GEN: No acute distress.   Neck: No JVD Cardiac: RRR, no murmurs, rubs, or gallops.  Respiratory: Clear to auscultation bilaterally. GI: Soft, nontender, non-distended  MS: No edema; No deformity. Right radial access site c/d/i with palpable radial pulse.  Neuro:  Nonfocal  Psych: Normal affect   Labs    High Sensitivity Troponin:   Recent Labs  Lab  05/07/20 2212 05/08/20 0010 05/27/20 1553 05/27/20 2000  TROPONINIHS '6 6 6 6      ' Chemistry Recent Labs  Lab 05/27/20 1553 05/29/20 0133  NA 136 136  K 4.0 3.2*  CL 102 104  CO2 29 25  GLUCOSE 99 126*  BUN 12 14  CREATININE 1.19 1.20  CALCIUM 9.4 8.5*  PROT 7.2  --   ALBUMIN 3.8  --   AST 20  --   ALT 19  --   ALKPHOS 57  --   BILITOT 0.6  --   GFRNONAA >60 >60  ANIONGAP 5 7     Hematology Recent Labs  Lab 05/27/20 1553 05/29/20 0133  WBC 8.7 9.0  RBC 5.05 4.68  HGB 14.7 13.5  HCT 44.8 41.0  MCV 88.7 87.6  MCH 29.1 28.8  MCHC 32.8 32.9  RDW 13.8 13.6  PLT 289 265    BNPNo results for input(s): BNP, PROBNP in the last 168 hours.   DDimer No results for input(s): DDIMER in the last 168 hours.   Radiology    DG Chest 2 View  Result Date: 05/27/2020 CLINICAL DATA:  Chest pain EXAM: CHEST - 2 VIEW COMPARISON:  05/07/2020 FINDINGS: Mild cardiomegaly.  No focal opacity, pleural effusion or pneumothorax. IMPRESSION: No active cardiopulmonary disease. Electronically Signed   By: Donavan Foil M.D.   On: 05/27/2020 16:16   CARDIAC CATHETERIZATION  Result Date: 05/28/2020 1. Left dominant coronary circulation 2. Severe proximal LAD stenosis, treated successfully with PCI using a 4.0x20 mm Synergy DES 3. Patent dominant LCx and patent nondominant RCA without significant stenoses  ECHOCARDIOGRAM COMPLETE  Result Date: 05/28/2020    ECHOCARDIOGRAM REPORT   Patient Name:   Jesse Wells Date of Exam: 05/28/2020 Medical Rec #:  [id number]       Height:       64.0 in Accession #:    [id number]      Weight:       216.5 lb Date of Birth:  August 13, 1965       BSA:          2.023 m Patient Age:    55 years        BP:           120/81 mmHg Patient Gender: M               HR:           58 bpm. Exam Location:  Inpatient Procedure: 2D Echo, Cardiac Doppler and Color Doppler Indications:    Chest pain  History:        Patient has prior history of Echocardiogram examinations and                  Patient has no prior history of Echocardiogram examinations.                 Risk Factors:Hypertension and Current Smoker.  Sonographer:    Cammy Brochure Referring Phys: [place]  1. Inferior basal hypokinesis . Left ventricular ejection fraction, by estimation, is 50 to 55%. The left ventricle has low normal function. The left ventricle has no regional wall motion abnormalities. Left ventricular diastolic parameters were normal.  2. Right ventricular systolic function is normal. The right ventricular size is normal.  3. The mitral valve is normal in structure. Trivial mitral valve regurgitation. No evidence of mitral stenosis.  4. The aortic valve is tricuspid. There is mild calcification of the aortic valve. Aortic valve regurgitation is not visualized. Mild aortic valve sclerosis is present, with no evidence of aortic valve stenosis.  5. The inferior vena cava is normal in size with greater than 50% respiratory variability, suggesting right atrial pressure of 3 mmHg. FINDINGS  Left Ventricle: Inferior basal hypokinesis. Left ventricular ejection fraction, by estimation, is 50 to 55%. The left ventricle has low normal function. The left ventricle has no regional wall motion abnormalities. The left ventricular internal cavity size was normal in size. There is no left ventricular hypertrophy. Left ventricular diastolic parameters were normal. Right Ventricle: The right ventricular size is normal. No increase in right ventricular wall thickness. Right ventricular systolic function is normal. Left Atrium: Left atrial size was normal in size. Right Atrium: Right atrial size was normal in size. Pericardium: There is no evidence of pericardial effusion. Mitral Valve: The mitral valve is normal in structure. There is mild thickening of the mitral valve leaflet(s). Trivial mitral valve regurgitation. No evidence of mitral valve stenosis. Tricuspid Valve: The tricuspid valve is normal in  structure. Tricuspid valve regurgitation is mild . No evidence of tricuspid stenosis. Aortic Valve: The aortic valve is tricuspid. There is mild calcification of the aortic valve. Aortic valve  regurgitation is not visualized. Mild aortic valve sclerosis is present, with no evidence of aortic valve stenosis. Aortic valve mean gradient measures 3.0 mmHg. Aortic valve peak gradient measures 5.1 mmHg. Aortic valve area, by VTI measures 2.81 cm. Pulmonic Valve: The pulmonic valve was normal in structure. Pulmonic valve regurgitation is trivial. No evidence of pulmonic stenosis. Aorta: The aortic root is normal in size and structure. Venous: The inferior vena cava is normal in size with greater than 50% respiratory variability, suggesting right atrial pressure of 3 mmHg. IAS/Shunts: No atrial level shunt detected by color flow Doppler.  LEFT VENTRICLE PLAX 2D LVIDd:         5.00 cm  Diastology LVIDs:         3.20 cm  LV e' medial:    5.44 cm/s LV PW:         1.00 cm  LV E/e' medial:  7.9 LV IVS:        1.10 cm  LV e' lateral:   7.51 cm/s LVOT diam:     2.30 cm  LV E/e' lateral: 5.7 LV SV:         61 LV SV Index:   30 LVOT Area:     4.15 cm  RIGHT VENTRICLE RV S prime:     9.79 cm/s TAPSE (M-mode): 2.2 cm LEFT ATRIUM             Index       RIGHT ATRIUM           Index LA diam:        3.30 cm 1.63 cm/m  RA Area:     17.50 cm LA Vol (A2C):   58.4 ml 28.86 ml/m RA Volume:   52.20 ml  25.80 ml/m LA Vol (A4C):   61.1 ml 30.20 ml/m LA Biplane Vol: 61.7 ml 30.49 ml/m  AORTIC VALVE AV Area (Vmax):    2.76 cm AV Area (Vmean):   2.82 cm AV Area (VTI):     2.81 cm AV Vmax:           113.00 cm/s AV Vmean:          76.700 cm/s AV VTI:            0.219 m AV Peak Grad:      5.1 mmHg AV Mean Grad:      3.0 mmHg LVOT Vmax:         75.10 cm/s LVOT Vmean:        52.100 cm/s LVOT VTI:          0.148 m LVOT/AV VTI ratio: 0.68  AORTA Ao Root diam: 3.30 cm Ao Asc diam:  2.90 cm MITRAL VALVE               TRICUSPID VALVE MV Area  (PHT): 4.89 cm    TR Peak grad:   17.6 mmHg MV Decel Time: 155 msec    TR Vmax:        210.00 cm/s MV E velocity: 42.80 cm/s MV A velocity: 70.70 cm/s  SHUNTS MV E/A ratio:  0.61        Systemic VTI:  0.15 m                            Systemic Diam: 2.30 cm Jenkins Rouge MD Electronically signed by Jenkins Rouge MD Signature Date/Time: 05/28/2020/4:52:19 PM    Final     Cardiac Studies   Cath  05/28/20: 1. Left dominant coronary circulation 2. Severe proximal LAD stenosis, treated successfully with PCI using a 4.0x20 mm Synergy DES 3. Patent dominant LCx and patent nondominant RCA without significant stenoses Diagnostic Dominance: Left  Left Anterior Descending  There is severe proximal LAD stenosis. Otherwise the vessel is patent with no stenosis. The LAD wraps around the LV apex and supplies the distal inferior wall.  Prox LAD lesion is 85% stenosed. The lesion is eccentric.  Left Circumflex  The vessel exhibits minimal luminal irregularities.  Right Coronary Artery  The vessel exhibits minimal luminal irregularities.   Intervention   Prox LAD lesion  Stent  CATH LAUNCHER 6FR EBU 3 guide catheter was inserted. Lesion crossed with guidewire using a WIRE COUGAR XT STRL 190CM. Pre-stent angioplasty was not performed. A drug-eluting stent was successfully placed using a SYNERGY XD 4.0X20. Maximum pressure: 14 atm. Post-stent angioplasty was performed using a BALLN [place] SAPPHIRE 4.5X15. Maximum pressure: 14 atm.  Post-Intervention Lesion Assessment  The intervention was successful. Pre-interventional TIMI flow is 3. Post-intervention TIMI flow is 3. No complications occurred at this lesion.  There is a 0% residual stenosis post intervention.    Coronary Diagrams   Diagnostic Dominance: Left    Intervention       TTE 05/28/20: IMPRESSIONS  1. Inferior basal hypokinesis . Left ventricular ejection fraction, by  estimation, is 50 to 55%. The left ventricle has low normal function.  The  left ventricle has no regional wall motion abnormalities. Left ventricular  diastolic parameters were normal.  2. Right ventricular systolic function is normal. The right ventricular  size is normal.  3. The mitral valve is normal in structure. Trivial mitral valve  regurgitation. No evidence of mitral stenosis.  4. The aortic valve is tricuspid. There is mild calcification of the  aortic valve. Aortic valve regurgitation is not visualized. Mild aortic  valve sclerosis is present, with no evidence of aortic valve stenosis.  5. The inferior vena cava is normal in size with greater than 50%  respiratory variability, suggesting right atrial pressure of 3 mmHg.   Patient Profile     55 y.o. male with history of HTN and tobacco abuse who has been experiencing intermittent chest pain with exertion for the past month that acutely worsened on the day of admission with concern for UA. LHC with proximal LAD stenosis now s/p PCI with DES with excellent angiographic results.   Assessment & Plan    #Unstable Angina: Patient presented with progressive exertional chest pain found to have prox LAD stenosis on cath now s/p successful PCI on 05/28/20. Doing well this morning.  -Continue ASA and plavix 27m daily -Continue metop 12.545mXL daily -Start losartan 2514maily -Cardiac rehab -Smoking cessation counseling  #HTN: -Stop HCTZ and change to losartan 59m31mily -Continue metop 12.5mg 24mdaily  #HLD: -Continue crestor 20mg 51my -Repeat lipids in 6-8 weeks with goal LDL<70  #Tobacco Abuse: -Counseled extensively about the need to quit; patient is motivated and will try nicotine patches at home  Okay to discharge home today on current medications. Will arrange for Cardiology follow-up. Repeat BMET in 1 week to monitor renal function on ARB. Plan for lipids in 6-8 weeks.      For questions or updates, please contact [facility] consult [email or web address] for contact info under         Signed, HeatheFreada Bergeron5/05/2020, 7:31 AM

## 2020-05-28 NOTE — H&P (View-Only) (Signed)
Cardiology Consultation:   Patient ID: Hoover Grewe MRN: [id number]; DOB: 1965/02/07  Admit date: 05/27/2020 Date of Consult: 05/28/2020  PCP:  Oneita Hurt, No   [facility] Providers  new Cardiologist:  None        Patient Profile:   Jesse Wells is a 55 y.o. male with a hx of HTN, and now 1 month hx CP, + tobacco use who is being seen 05/28/2020 for the evaluation of chest pain at the request of Dr. Benjamine Mola.  History of Present Illness:   Mr. Hashimi with above hx, was seen in ER 05/07/20 for chest pain, occurred with diaphoresis when walking and DOE.  Described as burning discomfort.  The pain was fairly consistent over 2 weeks, though comes and goes.  Does not awaken from sleep.  EKG was stable no acute changes, neg troponin with normal lytes and CBC.  No chest pain improvement with sl NTG.  Given appt with Dr. Mayford Knife 06/25/20.    BP on April visit 173/191  He was placed on HCTZ then and ASA.   Yesterday pt presented to ER for similar pain but worsening. Was walking in [employer] and pain strong enough he went to his knees. Now more of heavy discomfort ant chest.  + diaphoresis and DOE. BP on arrival 148/90 up to 160/88 now on home ASA HCTZ and NTG ointment.  Pain improved with NTG ointment.    EKG:  The EKG was personally reviewed and demonstrates:  All reviewed and SR no acute ST changes. All similar.  Telemetry:  Telemetry was personally reviewed and demonstrates:  SR  Na 136, K+ 4.0, Cr 1.19, LFTs WNL Hs troponin 6,6 Lipids LDL 100, HDL 40 Tchol 153, A1c 5.6  CBC normal 2V CXR NAD.   Neg COVID  Currently BP 128/73 P 58-60s and afebrile.   No street drugs, no pseudoephedrine.  About half pk tobacco daily,  Rare ETOH.  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    Past Medical History:  Diagnosis Date  . Chest pain   . Hypertension     Past Surgical History:  Procedure Laterality Date  . NO PAST SURGERIES       Home Medications:  Prior to  Admission medications   Medication Sig Start Date End Date Taking? Authorizing Provider  aspirin 81 MG chewable tablet Chew 1 tablet (81 mg total) by mouth daily. 05/08/20  Yes Cardama, Amadeo Garnet, MD  hydrochlorothiazide (HYDRODIURIL) 25 MG tablet Take 1 tablet (25 mg total) by mouth daily. 05/08/20  Yes Cardama, Amadeo Garnet, MD    Inpatient Medications: Scheduled Meds: . aspirin  81 mg Oral Daily  . Chlorhexidine Gluconate Cloth  6 each Topical Q0600  . enoxaparin (LOVENOX) injection  40 mg Subcutaneous Q24H  . hydrochlorothiazide  25 mg Oral Daily  . mupirocin ointment  1 application Nasal BID  . nitroGLYCERIN  1 inch Topical Q6H   Continuous Infusions:  PRN Meds: acetaminophen, ondansetron (ZOFRAN) IV  Allergies:   No Known Allergies  Social History:   Social History   Socioeconomic History  . Marital status: Married    Spouse name: Not on file  . Number of children: Not on file  . Years of education: Not on file  . Highest education level: Not on file  Occupational History  . Not on file  Tobacco Use  . Smoking status: Current Every Day Smoker    Packs/day: 0.50  . Smokeless tobacco: Not on  file  Substance and Sexual Activity  . Alcohol use: Never  . Drug use: Never  . Sexual activity: Not on file  Other Topics Concern  . Not on file  Social History Narrative  . Not on file   Social Determinants of Health   Financial Resource Strain: Not on file  Food Insecurity: Not on file  Transportation Needs: Not on file  Physical Activity: Not on file  Stress: Not on file  Social Connections: Not on file  Intimate Partner Violence: Not on file    Family History:    Family History  Problem Relation Age of Onset  . Hypertension Mother   . Diabetes Father   . Heart disease Neg Hx      ROS:  Please see the history of present illness.  General:no colds or fevers, no weight changes Skin:no rashes or ulcers HEENT:no blurred vision, no congestion CV:see  HPI PUL:see HPI GI:no diarrhea constipation or melena, no indigestion GU:no hematuria, no dysuria MS:no joint pain, no claudication Neuro:no syncope, no lightheadedness Endo:no diabetes, no thyroid disease  All other ROS reviewed and negative.     Physical Exam/Data:   Vitals:   05/28/20 0112 05/28/20 0114 05/28/20 0203 05/28/20 0721  BP:   (!) 144/87 128/73  Pulse: 67   61  Resp: 19   15  Temp:  97.6 F (36.4 C) 98.2 F (36.8 C) 97.7 F (36.5 C)  TempSrc:  Oral Oral Oral  SpO2: 99%   97%  Weight:   98.2 kg   Height:   5\' 4"  (1.626 m)     Intake/Output Summary (Last 24 hours) at 05/28/2020 1120 Last data filed at 05/28/2020 0800 Gross per 24 hour  Intake 20 ml  Output 400 ml  Net -380 ml   Last 3 Weights 05/28/2020  Weight (lbs) 216 lb 7.9 oz  Weight (kg) 98.2 kg     Body mass index is 37.16 kg/m.  General:  Well nourished, well developed, in no acute distress HEENT: normal Lymph: no adenopathy Neck: no JVD Endocrine:  No thryomegaly Vascular: No carotid bruits; FA pulses 2+ bilaterally without bruits  Cardiac:  normal S1, S2; RRR; no murmur gallup rub or click,  + some pain with palpation of mid sternal area.  Lungs:  clear to auscultation bilaterally, no wheezing, rhonchi or rales  Abd: soft, nontender, no hepatomegaly  Ext: no edema Musculoskeletal:  No deformities, BUE and BLE strength normal and equal Skin: warm and dry  Neuro:  Alert and oriented X 3 MAE follows commands, no focal abnormalities noted Psych:  Normal affect   Relevant CV Studies: none  Laboratory Data:  High Sensitivity Troponin:   Recent Labs  Lab 05/07/20 2212 05/08/20 0010 05/27/20 1553 05/27/20 2000  TROPONINIHS 6 6 6 6      Chemistry Recent Labs  Lab 05/27/20 1553  NA 136  K 4.0  CL 102  CO2 29  GLUCOSE 99  BUN 12  CREATININE 1.19  CALCIUM 9.4  GFRNONAA >60  ANIONGAP 5    Recent Labs  Lab 05/27/20 1553  PROT 7.2  ALBUMIN 3.8  AST 20  ALT 19  ALKPHOS 57   BILITOT 0.6   Hematology Recent Labs  Lab 05/27/20 1553  WBC 8.7  RBC 5.05  HGB 14.7  HCT 44.8  MCV 88.7  MCH 29.1  MCHC 32.8  RDW 13.8  PLT 289   BNPNo results for input(s): BNP, PROBNP in the last 168 hours.  DDimer No results  for input(s): DDIMER in the last 168 hours.   Radiology/Studies:  DG Chest 2 View  Result Date: 05/27/2020 CLINICAL DATA:  Chest pain EXAM: CHEST - 2 VIEW COMPARISON:  05/07/2020 FINDINGS: Mild cardiomegaly. No focal opacity, pleural effusion or pneumothorax. IMPRESSION: No active cardiopulmonary disease. Electronically Signed   By: Jasmine Pang M.D.   On: 05/27/2020 16:16     Assessment and Plan:   1. Chest pain with neg troponin, has been present for 1.5 months. EKG with no acute changes. May benefit from cardiac CTA to further eval.   If nuc study neg and pain continues would need to do further testing.  Some chest wall pain to palpation.  Improved symptoms with NTG.  Cardiac risk with HTN, tobacco use,  Plan for cath. 2. HTN elevated on arrival.  On HCTZ at home just started 2 weeks ago 3. Tobacco use. We discussed importance of stopping. 4. Lipid with LDL 100.  May need statin once study done.     Risk Assessment/Risk Scores:     HEAR Score (for undifferentiated chest pain):  HEAR Score: 6          For questions or updates, please contact [facility] Please consult [email or web address] for contact info under    Signed, Nada Boozer, NP  05/28/2020 11:20 AM   Patient seen and examined and agree with Nada Boozer, NP.  In brief, the patient is a 55 year old male with history of HTN and tobacco abuse who has been experiencing intermittent chest pain with exertion for the past month that acutely worsened on the day of admission prompting him to come to the ER.  In the ER, trops negative and ECG without ischemic changes. Given symptoms of progressive exertional chest pain, SOB and diaphoresis, however, very concerning for possible unstable  angina. Will plan for cath today.  GEN: No acute distress.   Neck: No JVD Cardiac: RRR, no murmurs, rubs, or gallops.  Respiratory: Clear to auscultation bilaterally. GI: Soft, nontender, non-distended  MS: No edema; No deformity. Neuro:  Nonfocal  Psych: Normal affect   Plan: -Plan for cath today -Check TTE -Continue ASA 81mg  daily -Start crestor 20mg  daily -Will add BB and ARB pending cath results -Continue home HCTZ for now -Smoking cessation provided and patient is amenable to quit; will start nicotine patch  INFORMED CONSENT: I have reviewed the risks, indications, and alternatives to cardiac catheterization, possible angioplasty, and stenting with the patient. Risks include but are not limited to bleeding, infection, vascular injury, stroke, myocardial infection, arrhythmia, kidney injury, radiation-related injury in the case of prolonged fluoroscopy use, emergency cardiac surgery, and death. The patient understands the risks of serious complication is 1-2 in 1000 with diagnostic cardiac cath and 1-2% or less with angioplasty/stenting.    , MD

## 2020-05-28 NOTE — Interval H&P Note (Signed)
History and Physical Interval Note:  05/28/2020 3:02 PM  Jesse Wells  has presented today for surgery, with the diagnosis of unstable angina.  The various methods of treatment have been discussed with the patient and family. After consideration of risks, benefits and other options for treatment, the patient has consented to  Procedure(s): LEFT HEART CATH AND CORONARY ANGIOGRAPHY (N/A) as a surgical intervention.  The patient's history has been reviewed, patient examined, no change in status, stable for surgery.  I have reviewed the patient's chart and labs.  Questions were answered to the patient's satisfaction.     Tonny Bollman

## 2020-05-28 NOTE — ED Notes (Signed)
Per Dr. Julian Reil pt can have something to eat right now. Pt given [place] sandwich bag and water

## 2020-05-28 NOTE — Consult Note (Addendum)
Cardiology Consultation:   Patient ID: Jesse Wells MRN: [id number]; DOB: 05/05/1965  Admit date: 05/27/2020 Date of Consult: 05/28/2020  PCP:  Pcp, No   [facility] Providers  new Cardiologist:  None        Patient Profile:   Jesse Wells is a 54 y.o. male with a hx of HTN, and now 1 month hx CP, + tobacco use who is being seen 05/28/2020 for the evaluation of chest pain at the request of Dr. Vann.  History of Present Illness:   Mr. Jesse Wells with above hx, was seen in ER 05/07/20 for chest pain, occurred with diaphoresis when walking and DOE.  Described as burning discomfort.  The pain was fairly consistent over 2 weeks, though comes and goes.  Does not awaken from sleep.  EKG was stable no acute changes, neg troponin with normal lytes and CBC.  No chest pain improvement with sl NTG.  Given appt with Dr. Turner 06/25/20.    BP on April visit 173/191  He was placed on HCTZ then and ASA.   Yesterday pt presented to ER for similar pain but worsening. Was walking in [employer] and pain strong enough he went to his knees. Now more of heavy discomfort ant chest.  + diaphoresis and DOE. BP on arrival 148/90 up to 160/88 now on home ASA HCTZ and NTG ointment.  Pain improved with NTG ointment.    EKG:  The EKG was personally reviewed and demonstrates:  All reviewed and SR no acute ST changes. All similar.  Telemetry:  Telemetry was personally reviewed and demonstrates:  SR  Na 136, K+ 4.0, Cr 1.19, LFTs WNL Hs troponin 6,6 Lipids LDL 100, HDL 40 Tchol 153, A1c 5.6  CBC normal 2V CXR NAD.   Neg COVID  Currently BP 128/73 P 58-60s and afebrile.   No street drugs, no pseudoephedrine.  About half pk tobacco daily,  Rare ETOH.  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    Past Medical History:  Diagnosis Date  . Chest pain   . Hypertension     Past Surgical History:  Procedure Laterality Date  . NO PAST SURGERIES       Home Medications:  Prior to  Admission medications   Medication Sig Start Date End Date Taking? Authorizing Provider  aspirin 81 MG chewable tablet Chew 1 tablet (81 mg total) by mouth daily. 05/08/20  Yes Cardama, Pedro Eduardo, MD  hydrochlorothiazide (HYDRODIURIL) 25 MG tablet Take 1 tablet (25 mg total) by mouth daily. 05/08/20  Yes Cardama, Pedro Eduardo, MD    Inpatient Medications: Scheduled Meds: . aspirin  81 mg Oral Daily  . Chlorhexidine Gluconate Cloth  6 each Topical Q0600  . enoxaparin (LOVENOX) injection  40 mg Subcutaneous Q24H  . hydrochlorothiazide  25 mg Oral Daily  . mupirocin ointment  1 application Nasal BID  . nitroGLYCERIN  1 inch Topical Q6H   Continuous Infusions:  PRN Meds: acetaminophen, ondansetron (ZOFRAN) IV  Allergies:   No Known Allergies  Social History:   Social History   Socioeconomic History  . Marital status: Married    Spouse name: Not on file  . Number of children: Not on file  . Years of education: Not on file  . Highest education level: Not on file  Occupational History  . Not on file  Tobacco Use  . Smoking status: Current Every Day Smoker    Packs/day: 0.50  . Smokeless tobacco: Not on   file  Substance and Sexual Activity  . Alcohol use: Never  . Drug use: Never  . Sexual activity: Not on file  Other Topics Concern  . Not on file  Social History Narrative  . Not on file   Social Determinants of Health   Financial Resource Strain: Not on file  Food Insecurity: Not on file  Transportation Needs: Not on file  Physical Activity: Not on file  Stress: Not on file  Social Connections: Not on file  Intimate Partner Violence: Not on file    Family History:    Family History  Problem Relation Age of Onset  . Hypertension Mother   . Diabetes Father   . Heart disease Neg Hx      ROS:  Please see the history of present illness.  General:no colds or fevers, no weight changes Skin:no rashes or ulcers HEENT:no blurred vision, no congestion CV:see  HPI PUL:see HPI GI:no diarrhea constipation or melena, no indigestion GU:no hematuria, no dysuria MS:no joint pain, no claudication Neuro:no syncope, no lightheadedness Endo:no diabetes, no thyroid disease  All other ROS reviewed and negative.     Physical Exam/Data:   Vitals:   05/28/20 0112 05/28/20 0114 05/28/20 0203 05/28/20 0721  BP:   (!) 144/87 128/73  Pulse: 67   61  Resp: 19   15  Temp:  97.6 F (36.4 C) 98.2 F (36.8 C) 97.7 F (36.5 C)  TempSrc:  Oral Oral Oral  SpO2: 99%   97%  Weight:   98.2 kg   Height:   5\' 4"  (1.626 m)     Intake/Output Summary (Last 24 hours) at 05/28/2020 1120 Last data filed at 05/28/2020 0800 Gross per 24 hour  Intake 20 ml  Output 400 ml  Net -380 ml   Last 3 Weights 05/28/2020  Weight (lbs) 216 lb 7.9 oz  Weight (kg) 98.2 kg     Body mass index is 37.16 kg/m.  General:  Well nourished, well developed, in no acute distress HEENT: normal Lymph: no adenopathy Neck: no JVD Endocrine:  No thryomegaly Vascular: No carotid bruits; FA pulses 2+ bilaterally without bruits  Cardiac:  normal S1, S2; RRR; no murmur gallup rub or click,  + some pain with palpation of mid sternal area.  Lungs:  clear to auscultation bilaterally, no wheezing, rhonchi or rales  Abd: soft, nontender, no hepatomegaly  Ext: no edema Musculoskeletal:  No deformities, BUE and BLE strength normal and equal Skin: warm and dry  Neuro:  Alert and oriented X 3 MAE follows commands, no focal abnormalities noted Psych:  Normal affect   Relevant CV Studies: none  Laboratory Data:  High Sensitivity Troponin:   Recent Labs  Lab 05/07/20 2212 05/08/20 0010 05/27/20 1553 05/27/20 2000  TROPONINIHS 6 6 6 6      Chemistry Recent Labs  Lab 05/27/20 1553  NA 136  K 4.0  CL 102  CO2 29  GLUCOSE 99  BUN 12  CREATININE 1.19  CALCIUM 9.4  GFRNONAA >60  ANIONGAP 5    Recent Labs  Lab 05/27/20 1553  PROT 7.2  ALBUMIN 3.8  AST 20  ALT 19  ALKPHOS 57   BILITOT 0.6   Hematology Recent Labs  Lab 05/27/20 1553  WBC 8.7  RBC 5.05  HGB 14.7  HCT 44.8  MCV 88.7  MCH 29.1  MCHC 32.8  RDW 13.8  PLT 289   BNPNo results for input(s): BNP, PROBNP in the last 168 hours.  DDimer No results  for input(s): DDIMER in the last 168 hours.   Radiology/Studies:  DG Chest 2 View  Result Date: 05/27/2020 CLINICAL DATA:  Chest pain EXAM: CHEST - 2 VIEW COMPARISON:  05/07/2020 FINDINGS: Mild cardiomegaly. No focal opacity, pleural effusion or pneumothorax. IMPRESSION: No active cardiopulmonary disease. Electronically Signed   By: Jasmine Pang M.D.   On: 05/27/2020 16:16     Assessment and Plan:   1. Chest pain with neg troponin, has been present for 1.5 months. EKG with no acute changes. May benefit from cardiac CTA to further eval.   If nuc study neg and pain continues would need to do further testing.  Some chest wall pain to palpation.  Improved symptoms with NTG.  Cardiac risk with HTN, tobacco use,  Plan for cath. 2. HTN elevated on arrival.  On HCTZ at home just started 2 weeks ago 3. Tobacco use. We discussed importance of stopping. 4. Lipid with LDL 100.  May need statin once study done.     Risk Assessment/Risk Scores:     HEAR Score (for undifferentiated chest pain):  HEAR Score: 6          For questions or updates, please contact [facility] Please consult [email or web address] for contact info under    Signed, Nada Boozer, NP  05/28/2020 11:20 AM   Patient seen and examined and agree with Nada Boozer, NP.  In brief, the patient is a 55 year old male with history of HTN and tobacco abuse who has been experiencing intermittent chest pain with exertion for the past month that acutely worsened on the day of admission prompting him to come to the ER.  In the ER, trops negative and ECG without ischemic changes. Given symptoms of progressive exertional chest pain, SOB and diaphoresis, however, very concerning for possible unstable  angina. Will plan for cath today.  GEN: No acute distress.   Neck: No JVD Cardiac: RRR, no murmurs, rubs, or gallops.  Respiratory: Clear to auscultation bilaterally. GI: Soft, nontender, non-distended  MS: No edema; No deformity. Neuro:  Nonfocal  Psych: Normal affect   Plan: -Plan for cath today -Check TTE -Continue ASA 81mg  daily -Start crestor 20mg  daily -Will add BB and ARB pending cath results -Continue home HCTZ for now -Smoking cessation provided and patient is amenable to quit; will start nicotine patch  INFORMED CONSENT: I have reviewed the risks, indications, and alternatives to cardiac catheterization, possible angioplasty, and stenting with the patient. Risks include but are not limited to bleeding, infection, vascular injury, stroke, myocardial infection, arrhythmia, kidney injury, radiation-related injury in the case of prolonged fluoroscopy use, emergency cardiac surgery, and death. The patient understands the risks of serious complication is 1-2 in 1000 with diagnostic cardiac cath and 1-2% or less with angioplasty/stenting.    , MD

## 2020-05-28 NOTE — Progress Notes (Signed)
  Echocardiogram 2D Echocardiogram has been performed.  Jesse Wells  [name] 05/28/2020, 2:59 PM

## 2020-05-28 NOTE — ED Provider Notes (Signed)
I was called by [facility] pharmacy.  Patient was prescribed Percocet, but the dose is too high for him.  I reviewed his chart and noted that he is admitted to the hospital.  I told the pharmacist it is okay to discontinue this medication at this point.   Koleen Distance, MD 05/28/20 [phone]

## 2020-05-28 NOTE — Progress Notes (Signed)
Patient placed in observation with chest pain.  Cardiology to see.  ? Heart cath per note JV

## 2020-05-28 NOTE — ED Provider Notes (Signed)
[facility] EMERGENCY DEPARTMENT Provider Note   CSN: [id number] Arrival date & time: 05/27/20  1456     History Chief Complaint  Patient presents with  . Chest Pain    Jesse Wells is a 55 y.o. male.  HPI Patient has been having chest pain for about a month now.  It has been increasing in frequency.  He is a heavy pressure on the anterior aspect of his chest.  Patient reports since being seen in the emergency department, he has gotten chest pain with exertion.  No syncopal episodes.  No swelling or pain of the lower extremities.  Patient does smoke cigarettes.  He smokes about a pack every 2 days.  He was recently diagnosed with hypertension at the time of his last visit to the emergency department.  He reports he has been taking a daily aspirin and his antihypertensive medication.  This has not changed the frequency or severity of his pain.  Denies any pain with eating.  No recent cough fever.    Past Medical History:  Diagnosis Date  . Chest pain   . Hypertension     Patient Active Problem List   Diagnosis Date Noted  . Chest pain, rule out acute myocardial infarction 05/27/2020    Past Surgical History:  Procedure Laterality Date  . NO PAST SURGERIES         No family history on file.     Home Medications Prior to Admission medications   Medication Sig Start Date End Date Taking? Authorizing Provider  aspirin 81 MG chewable tablet Chew 1 tablet (81 mg total) by mouth daily. 05/08/20  Yes Cardama, Amadeo Garnet, MD  hydrochlorothiazide (HYDRODIURIL) 25 MG tablet Take 1 tablet (25 mg total) by mouth daily. 05/08/20  Yes Cardama, Amadeo Garnet, MD    Allergies    Patient has no known allergies.  Review of Systems   Review of Systems 10 systems reviewed and negative except as per HPI Physical Exam Updated Vital Signs BP (!) 144/85   Pulse (!) 59   Temp 97.8 F (36.6 C) (Oral)   Resp 18   SpO2 99%   Physical Exam Constitutional:       Appearance: He is well-developed.  HENT:     Head: Normocephalic and atraumatic.  Eyes:     Pupils: Pupils are equal, round, and reactive to light.  Cardiovascular:     Rate and Rhythm: Normal rate and regular rhythm.     Heart sounds: Normal heart sounds.  Pulmonary:     Effort: Pulmonary effort is normal.     Breath sounds: Normal breath sounds.  Abdominal:     General: Bowel sounds are normal. There is no distension.     Palpations: Abdomen is soft.     Tenderness: There is no abdominal tenderness.  Musculoskeletal:        General: Normal range of motion.     Cervical back: Neck supple.  Skin:    General: Skin is warm and dry.  Neurological:     Mental Status: He is alert and oriented to person, place, and time.     GCS: GCS eye subscore is 4. GCS verbal subscore is 5. GCS motor subscore is 6.     Coordination: Coordination normal.     ED Results / Procedures / Treatments   Labs (all labs ordered are listed, but only abnormal results are displayed) Labs Reviewed  RESP PANEL BY RT-PCR (FLU A&B, COVID) ARPGX2  COMPREHENSIVE METABOLIC  PANEL  LIPASE, BLOOD  CBC WITH DIFFERENTIAL/PLATELET  HIV ANTIBODY (ROUTINE TESTING W REFLEX)  TROPONIN I (HIGH SENSITIVITY)  TROPONIN I (HIGH SENSITIVITY)    EKG EKG Interpretation  Date/Time:  Tuesday May 27 2020 15:33:28 EDT Ventricular Rate:  68 PR Interval:  138 QRS Duration: 92 QT Interval:  384 QTC Calculation: 408 R Axis:   -12 Text Interpretation: Normal sinus rhythm Normal ECG normal, no sig changefrom previous Confirmed by Arby Barrette [phone]) on 05/27/2020 9:05:05 PM   Radiology DG Chest 2 View  Result Date: 05/27/2020 CLINICAL DATA:  Chest pain EXAM: CHEST - 2 VIEW COMPARISON:  05/07/2020 FINDINGS: Mild cardiomegaly. No focal opacity, pleural effusion or pneumothorax. IMPRESSION: No active cardiopulmonary disease. Electronically Signed   By: Jasmine Pang M.D.   On: 05/27/2020 16:16    Procedures Procedures    Medications Ordered in ED Medications  nitroGLYCERIN (NITROGLYN) 2 % ointment 1 inch (1 inch Topical Given 05/27/20 2343)  aspirin chewable tablet 81 mg (has no administration in time range)  hydrochlorothiazide (HYDRODIURIL) tablet 25 mg (has no administration in time range)  acetaminophen (TYLENOL) tablet 650 mg (has no administration in time range)  ondansetron (ZOFRAN) injection 4 mg (has no administration in time range)  enoxaparin (LOVENOX) injection 40 mg (has no administration in time range)  aspirin chewable tablet 324 mg (324 mg Oral Given 05/27/20 2341)  famotidine (PEPCID) tablet 40 mg (40 mg Oral Given 05/27/20 2342)    ED Course  I have reviewed the triage vital signs and the nursing notes.  Pertinent labs & imaging results that were available during my care of the patient were reviewed by me and considered in my medical decision making (see chart for details).  Clinical Course as of 05/28/20 0024  Tue May 27, 2020  2311 Consult: Reviewed with [facility] cardiology Dr. Thelma Barge.  Recommends admission to medical service and stress test in a.m. [MP]  2354 Consult: Dr. Julian Reil for admission [MP]    Clinical Course User Index [MP] Arby Barrette, MD   MDM Rules/Calculators/A&P                          Patient presents as outlined.  He does have risk factors for cardiac disease.  Patient has been having increasing frequency of chest pain over the course of about a month.  Today he experienced exertional chest pain.  Troponin negative.  EKG does not show a STEMI pattern.  Reviewed with cardiology consult.  Plan to admit for further diagnostic evaluation and stress test. Final Clinical Impression(s) / ED Diagnoses Final diagnoses:  Precordial pain  Exertional chest pain    Rx / DC Orders ED Discharge Orders         Ordered    naproxen (NAPROSYN) 375 MG tablet  2 times daily,   Status:  Discontinued        05/27/20 2123    oxyCODONE-acetaminophen (PERCOCET) 5-325 MG tablet   Every 6 hours PRN,   Status:  Discontinued        05/27/20 2123           Arby Barrette, MD 05/28/20 [phone]

## 2020-05-29 ENCOUNTER — Other Ambulatory Visit (HOSPITAL_COMMUNITY): Payer: Self-pay

## 2020-05-29 ENCOUNTER — Encounter (HOSPITAL_COMMUNITY): Payer: Self-pay | Admitting: Cardiovascular Disease

## 2020-05-29 DIAGNOSIS — I10 Essential (primary) hypertension: Secondary | ICD-10-CM

## 2020-05-29 DIAGNOSIS — I25110 Atherosclerotic heart disease of native coronary artery with unstable angina pectoris: Principal | ICD-10-CM

## 2020-05-29 LAB — CBC
HCT: 41 % (ref 39.0–52.0)
Hemoglobin: 13.5 g/dL (ref 13.0–17.0)
MCH: 28.8 pg (ref 26.0–34.0)
MCHC: 32.9 g/dL (ref 30.0–36.0)
MCV: 87.6 fL (ref 80.0–100.0)
Platelets: 265 10*3/uL (ref 150–400)
RBC: 4.68 MIL/uL (ref 4.22–5.81)
RDW: 13.6 % (ref 11.5–15.5)
WBC: 9 10*3/uL (ref 4.0–10.5)
nRBC: 0 % (ref 0.0–0.2)

## 2020-05-29 LAB — BASIC METABOLIC PANEL
Anion gap: 7 (ref 5–15)
BUN: 14 mg/dL (ref 6–20)
CO2: 25 mmol/L (ref 22–32)
Calcium: 8.5 mg/dL — ABNORMAL LOW (ref 8.9–10.3)
Chloride: 104 mmol/L (ref 98–111)
Creatinine, Ser: 1.2 mg/dL (ref 0.61–1.24)
GFR, Estimated: >60 mL/min (ref >60)
Glucose, Bld: 126 mg/dL — ABNORMAL HIGH (ref 70–99)
Potassium: 3.2 mmol/L — ABNORMAL LOW (ref 3.5–5.1)
Sodium: 136 mmol/L (ref 135–145)

## 2020-05-29 MED ORDER — NICOTINE 14 MG/24HR TD PT24
14.0000 mg | MEDICATED_PATCH | Freq: Every day | TRANSDERMAL | 0 refills | Status: DC
  Filled 2020-05-29 (×2): qty 28, 28d supply, fill #0

## 2020-05-29 MED ORDER — POTASSIUM CHLORIDE CRYS ER 20 MEQ PO TBCR
40.0000 meq | EXTENDED_RELEASE_TABLET | Freq: Once | ORAL | Status: AC
  Administered 2020-05-29: 40 meq via ORAL
  Filled 2020-05-29: qty 2

## 2020-05-29 MED ORDER — CLOPIDOGREL BISULFATE 75 MG PO TABS
75.0000 mg | ORAL_TABLET | Freq: Every day | ORAL | 1 refills | Status: DC
  Filled 2020-05-29: qty 30, 30d supply, fill #0

## 2020-05-29 MED ORDER — LOSARTAN POTASSIUM 25 MG PO TABS
25.0000 mg | ORAL_TABLET | Freq: Every day | ORAL | 1 refills | Status: DC
  Filled 2020-05-29: qty 30, 30d supply, fill #0

## 2020-05-29 MED ORDER — ROSUVASTATIN CALCIUM 20 MG PO TABS
20.0000 mg | ORAL_TABLET | Freq: Every day | ORAL | 0 refills | Status: DC
  Filled 2020-05-29: qty 30, 30d supply, fill #0

## 2020-05-29 MED ORDER — METOPROLOL SUCCINATE ER 25 MG PO TB24
12.5000 mg | ORAL_TABLET | Freq: Every day | ORAL | 1 refills | Status: DC
  Filled 2020-05-29: qty 15, 30d supply, fill #0

## 2020-05-29 NOTE — Progress Notes (Signed)
CARDIAC REHAB PHASE I   PRE:  Rate/Rhythm: 81 SR  BP:  Supine: 142/72  Sitting:   Standing:    SaO2: 98%RA  MODE:  Ambulation: 450 ft   POST:  Rate/Rhythm: 92 SR  BP:  Supine:   Sitting: 127/80  Standing:    SaO2: 97%RA [id number] Pt walked 450 ft on RA and happy to be up walking. No CP. Tolerated well. Education completed with pt who voiced understanding. Stressed importance of plavix with stent. Reviewed NTG use, walking for ex, gave heart healthy diet, discussed CRP 2 and referred to [facility]. Pt given smoking cessation handout and encouraged to call [phone] if needed. Pt stated he has quit now. Pt very interested in attending CRP 2.   Luetta Nutting, RN BSN  05/29/2020 9:28 AM

## 2020-05-29 NOTE — Plan of Care (Signed)

## 2020-05-29 NOTE — Progress Notes (Signed)
RN went over d/c paperwork with pt and his wife. NT removed pt's IVs. Pt belongings with pt and his wife, including meds from pharmacy. Pt's wife to transport pt home. NT transporting pt to private vehicle.

## 2020-05-29 NOTE — Discharge Summary (Signed)
Physician Discharge Summary  Jesse Wells:[id number] DOB: 1965-11-17 DOA: 05/27/2020  PCP: Pcp, No  Admit date: 05/27/2020 Discharge date: 05/29/2020  Admitted From: Home Discharge disposition: Home   Recommendations for Outpatient Follow-Up:   1. Letter given for return to work 2. BMP 1 week: LFTs 6 to 8 weeks   Discharge Diagnosis:   Principal Problem:   Chest pain, rule out acute myocardial infarction Active Problems:   HTN (hypertension)   Coronary artery disease involving native coronary artery of native heart with unstable angina pectoris [facility])    Discharge Condition: Improved.  Diet recommendation: Low sodium, heart healthy  Wound care: None.  Code status: Full.   History of Present Illness:   Jesse Wells is a 55 y.o. male with medical history significant of HTN.  Pt presents to ED with ~1 month h/o CP.  Increasing in frequency.  Pt describes heavy pressure on anterior portion of chest.   Hospital Course by Problem:   Unstable Angina: Patient presented with progressive exertional chest pain found to have prox LAD stenosis on cath now s/p successful PCI on 05/28/20.  -Continue ASA and plavix 75mg  daily -Continue metop 12.5mg  XL daily -Start losartan 25mg  daily -Cardiac rehab -Smoking cessation counseling  #HTN: -Stop HCTZ and change to losartan 25mg  daily -Continue metop 12.5mg  XL daily  #HLD: - crestor 20mg  daily -Repeat lipids in 6-8 weeks with goal LDL<70  Tobacco Abuse: -nicotine patches at home    Medical Consultants:   Cardiology   Discharge Exam:   Vitals:   05/29/20 0300 05/29/20 0738  BP: 131/79 130/83  Pulse: 83 75  Resp: 18 18  Temp: 98.2 F (36.8 C) 97.9 F (36.6 C)  SpO2: 97% 95%   Vitals:   05/29/20 0150 05/29/20 0240 05/29/20 0300 05/29/20 0738  BP: 128/71 139/80 131/79 130/83  Pulse: 80 80 83 75  Resp:   18 18  Temp:   98.2 F (36.8 C) 97.9 F (36.6 C)  TempSrc:   Oral Oral  SpO2: 98% 98%  97% 95%  Weight:      Height:        General exam: Appears calm and comfortable.     The results of significant diagnostics from this hospitalization (including imaging, microbiology, ancillary and laboratory) are listed below for reference.     Procedures and Diagnostic Studies:   DG Chest 2 View  Result Date: 05/27/2020 CLINICAL DATA:  Chest pain EXAM: CHEST - 2 VIEW COMPARISON:  05/07/2020 FINDINGS: Mild cardiomegaly. No focal opacity, pleural effusion or pneumothorax. IMPRESSION: No active cardiopulmonary disease. Electronically Signed   By: 07/29/20 M.D.   On: 05/27/2020 16:16   CARDIAC CATHETERIZATION  Result Date: 05/28/2020 1. Left dominant coronary circulation 2. Severe proximal LAD stenosis, treated successfully with PCI using a 4.0x20 mm Synergy DES 3. Patent dominant LCx and patent nondominant RCA without significant stenoses  ECHOCARDIOGRAM COMPLETE  Result Date: 05/28/2020    ECHOCARDIOGRAM REPORT   Patient Name:   Jesse Wells Date of Exam: 05/28/2020 Medical Rec #:  07/28/2020       Height:       64.0 in Accession #:    07/28/2020      Weight:       216.5 lb Date of Birth:  Mar 11, 1965       BSA:          2.023 m Patient Age:    54 years        BP:  120/81 mmHg Patient Gender: M               HR:           58 bpm. Exam Location:  Inpatient Procedure: 2D Echo, Cardiac Doppler and Color Doppler Indications:    Chest pain  History:        Patient has prior history of Echocardiogram examinations and                 Patient has no prior history of Echocardiogram examinations.                 Risk Factors:Hypertension and Current Smoker.  Sonographer:    Shirlean Kelly Referring Phys: [id number] LAURA R INGOLD IMPRESSIONS  1. Inferior basal hypokinesis . Left ventricular ejection fraction, by estimation, is 50 to 55%. The left ventricle has low normal function. The left ventricle has no regional wall motion abnormalities. Left ventricular diastolic parameters were normal.  2.  Right ventricular systolic function is normal. The right ventricular size is normal.  3. The mitral valve is normal in structure. Trivial mitral valve regurgitation. No evidence of mitral stenosis.  4. The aortic valve is tricuspid. There is mild calcification of the aortic valve. Aortic valve regurgitation is not visualized. Mild aortic valve sclerosis is present, with no evidence of aortic valve stenosis.  5. The inferior vena cava is normal in size with greater than 50% respiratory variability, suggesting right atrial pressure of 3 mmHg. FINDINGS  Left Ventricle: Inferior basal hypokinesis. Left ventricular ejection fraction, by estimation, is 50 to 55%. The left ventricle has low normal function. The left ventricle has no regional wall motion abnormalities. The left ventricular internal cavity size was normal in size. There is no left ventricular hypertrophy. Left ventricular diastolic parameters were normal. Right Ventricle: The right ventricular size is normal. No increase in right ventricular wall thickness. Right ventricular systolic function is normal. Left Atrium: Left atrial size was normal in size. Right Atrium: Right atrial size was normal in size. Pericardium: There is no evidence of pericardial effusion. Mitral Valve: The mitral valve is normal in structure. There is mild thickening of the mitral valve leaflet(s). Trivial mitral valve regurgitation. No evidence of mitral valve stenosis. Tricuspid Valve: The tricuspid valve is normal in structure. Tricuspid valve regurgitation is mild . No evidence of tricuspid stenosis. Aortic Valve: The aortic valve is tricuspid. There is mild calcification of the aortic valve. Aortic valve regurgitation is not visualized. Mild aortic valve sclerosis is present, with no evidence of aortic valve stenosis. Aortic valve mean gradient measures 3.0 mmHg. Aortic valve peak gradient measures 5.1 mmHg. Aortic valve area, by VTI measures 2.81 cm. Pulmonic Valve: The pulmonic  valve was normal in structure. Pulmonic valve regurgitation is trivial. No evidence of pulmonic stenosis. Aorta: The aortic root is normal in size and structure. Venous: The inferior vena cava is normal in size with greater than 50% respiratory variability, suggesting right atrial pressure of 3 mmHg. IAS/Shunts: No atrial level shunt detected by color flow Doppler.  LEFT VENTRICLE PLAX 2D LVIDd:         5.00 cm  Diastology LVIDs:         3.20 cm  LV e' medial:    5.44 cm/s LV PW:         1.00 cm  LV E/e' medial:  7.9 LV IVS:        1.10 cm  LV e' lateral:   7.51 cm/s LVOT diam:  2.30 cm  LV E/e' lateral: 5.7 LV SV:         61 LV SV Index:   30 LVOT Area:     4.15 cm  RIGHT VENTRICLE RV S prime:     9.79 cm/s TAPSE (M-mode): 2.2 cm LEFT ATRIUM             Index       RIGHT ATRIUM           Index LA diam:        3.30 cm 1.63 cm/m  RA Area:     17.50 cm LA Vol (A2C):   58.4 ml 28.86 ml/m RA Volume:   52.20 ml  25.80 ml/m LA Vol (A4C):   61.1 ml 30.20 ml/m LA Biplane Vol: 61.7 ml 30.49 ml/m  AORTIC VALVE AV Area (Vmax):    2.76 cm AV Area (Vmean):   2.82 cm AV Area (VTI):     2.81 cm AV Vmax:           113.00 cm/s AV Vmean:          76.700 cm/s AV VTI:            0.219 m AV Peak Grad:      5.1 mmHg AV Mean Grad:      3.0 mmHg LVOT Vmax:         75.10 cm/s LVOT Vmean:        52.100 cm/s LVOT VTI:          0.148 m LVOT/AV VTI ratio: 0.68  AORTA Ao Root diam: 3.30 cm Ao Asc diam:  2.90 cm MITRAL VALVE               TRICUSPID VALVE MV Area (PHT): 4.89 cm    TR Peak grad:   17.6 mmHg MV Decel Time: 155 msec    TR Vmax:        210.00 cm/s MV E velocity: 42.80 cm/s MV A velocity: 70.70 cm/s  SHUNTS MV E/A ratio:  0.61        Systemic VTI:  0.15 m                            Systemic Diam: 2.30 cm Charlton HawsPeter Nishan MD Electronically signed by Charlton HawsPeter Nishan MD Signature Date/Time: 05/28/2020/4:52:19 PM    Final      Labs:   Basic Metabolic Panel: Recent Labs  Lab 05/27/20 1553 05/29/20 0133  NA 136 136  K 4.0  3.2*  CL 102 104  CO2 29 25  GLUCOSE 99 126*  BUN 12 14  CREATININE 1.19 1.20  CALCIUM 9.4 8.5*   GFR Estimated Creatinine Clearance: 75.3 mL/min (by C-G formula based on SCr of 1.2 mg/dL). Liver Function Tests: Recent Labs  Lab 05/27/20 1553  AST 20  ALT 19  ALKPHOS 57  BILITOT 0.6  PROT 7.2  ALBUMIN 3.8   Recent Labs  Lab 05/27/20 1553  LIPASE 34   No results for input(s): AMMONIA in the last 168 hours. Coagulation profile No results for input(s): INR, PROTIME in the last 168 hours.  CBC: Recent Labs  Lab 05/27/20 1553 05/29/20 0133  WBC 8.7 9.0  NEUTROABS 5.1  --   HGB 14.7 13.5  HCT 44.8 41.0  MCV 88.7 87.6  PLT 289 265   Cardiac Enzymes: No results for input(s): CKTOTAL, CKMB, CKMBINDEX, TROPONINI in the last 168 hours. BNP: Invalid input(s): POCBNP CBG: No  results for input(s): GLUCAP in the last 168 hours. D-Dimer No results for input(s): DDIMER in the last 72 hours. Hgb A1c Recent Labs    05/28/20 0356  HGBA1C 5.6   Lipid Profile Recent Labs    05/28/20 0356  CHOL 153  HDL 40*  LDLCALC 100*  TRIG 65  CHOLHDL 3.8   Thyroid function studies No results for input(s): TSH, T4TOTAL, T3FREE, THYROIDAB in the last 72 hours.  Invalid input(s): FREET3 Anemia work up No results for input(s): VITAMINB12, FOLATE, FERRITIN, TIBC, IRON, RETICCTPCT in the last 72 hours. Microbiology Recent Results (from the past 240 hour(s))  Resp Panel by RT-PCR (Flu A&B, Covid) Nasopharyngeal Swab     Status: None   Collection Time: 05/27/20 12:08 AM   Specimen: Nasopharyngeal Swab; Nasopharyngeal(NP) swabs in vial transport medium  Result Value Ref Range Status   SARS Coronavirus 2 by RT PCR NEGATIVE NEGATIVE Final    Comment: (NOTE) SARS-CoV-2 target nucleic acids are NOT DETECTED.  The SARS-CoV-2 RNA is generally detectable in upper respiratory specimens during the acute phase of infection. The lowest concentration of SARS-CoV-2 viral copies this assay  can detect is 138 copies/mL. A negative result does not preclude SARS-Cov-2 infection and should not be used as the sole basis for treatment or other patient management decisions. A negative result may occur with  improper specimen collection/handling, submission of specimen other than nasopharyngeal swab, presence of viral mutation(s) within the areas targeted by this assay, and inadequate number of viral copies(<138 copies/mL). A negative result must be combined with clinical observations, patient history, and epidemiological information. The expected result is Negative.  Fact Sheet for Patients:  BloggerCourse.com  Fact Sheet for Healthcare Providers:  SeriousBroker.it  This test is no t yet approved or cleared by the [place] FDA and  has been authorized for detection and/or diagnosis of SARS-CoV-2 by FDA under an Emergency Use Authorization (EUA). This EUA will remain  in effect (meaning this test can be used) for the duration of the COVID-19 declaration under Section 564(b)(1) of the Act, 21 U.S.C.section 360bbb-3(b)(1), unless the authorization is terminated  or revoked sooner.       Influenza A by PCR NEGATIVE NEGATIVE Final   Influenza B by PCR NEGATIVE NEGATIVE Final    Comment: (NOTE) The Xpert Xpress SARS-CoV-2/FLU/RSV plus assay is intended as an aid in the diagnosis of influenza from Nasopharyngeal swab specimens and should not be used as a sole basis for treatment. Nasal washings and aspirates are unacceptable for Xpert Xpress SARS-CoV-2/FLU/RSV testing.  Fact Sheet for Patients: BloggerCourse.com  Fact Sheet for Healthcare Providers: SeriousBroker.it  This test is not yet approved or cleared by the [place] FDA and has been authorized for detection and/or diagnosis of SARS-CoV-2 by FDA under an Emergency Use Authorization (EUA). This EUA will  remain in effect (meaning this test can be used) for the duration of the COVID-19 declaration under Section 564(b)(1) of the Act, 21 U.S.C. section 360bbb-3(b)(1), unless the authorization is terminated or revoked.  Performed at [facility], [address] [place], [place] [address]   MRSA PCR Screening     Status: Abnormal   Collection Time: 05/28/20  1:50 AM   Specimen: Nasal Mucosa; Nasopharyngeal  Result Value Ref Range Status   MRSA by PCR POSITIVE (A) NEGATIVE Final    Comment:        The GeneXpert MRSA Assay (FDA approved for NASAL specimens only), is one component of a comprehensive MRSA colonization  surveillance program. It is not intended to diagnose MRSA infection nor to guide or monitor treatment for MRSA infections. RESULT CALLED TO, READ BACK BY AND VERIFIED WITH: FIELDS,D RN [phone] 05/28/2020 MITCHELL,L Performed at [facility], [address] [place], [place] [address]      Discharge Instructions:   Discharge Instructions    Diet - low sodium heart healthy   Complete by: As directed    Increase activity slowly   Complete by: As directed      Allergies as of 05/29/2020   No Known Allergies     Medication List    STOP taking these medications   hydrochlorothiazide 25 MG tablet Commonly known as: HYDRODIURIL     TAKE these medications   aspirin 81 MG chewable tablet Chew 1 tablet (81 mg total) by mouth daily.   clopidogrel 75 MG tablet Commonly known as: PLAVIX Take 1 tablet (75 mg total) by mouth daily with breakfast.   losartan 25 MG tablet Commonly known as: COZAAR Take 1 tablet (25 mg total) by mouth daily.   metoprolol succinate 25 MG 24 hr tablet Commonly known as: TOPROL-XL Take 0.5 tablets (12.5 mg total) by mouth daily.   nicotine 14 mg/24hr patch Commonly known as: NICODERM CQ - dosed in mg/24 hours Place 1 patch (14 mg total) onto the skin daily.   rosuvastatin 20 MG tablet Commonly known as: CRESTOR Take 1  tablet (20 mg total) by mouth daily.         Time coordinating discharge: 25 min  Signed:  Joseph Art DO  [facility] 05/29/2020, 9:16 AM

## 2020-05-30 ENCOUNTER — Telehealth (HOSPITAL_COMMUNITY): Payer: Self-pay | Admitting: Pharmacist

## 2020-05-30 NOTE — Telephone Encounter (Signed)
Attempted to call pt- pt disconnected call

## 2020-06-04 ENCOUNTER — Telehealth (HOSPITAL_COMMUNITY): Payer: Self-pay

## 2020-06-04 NOTE — Telephone Encounter (Signed)
Attempted to contact pt in regards to CR, unable to leave VM, no VM box set up.

## 2020-06-05 ENCOUNTER — Other Ambulatory Visit (HOSPITAL_COMMUNITY): Payer: Self-pay

## 2020-06-06 ENCOUNTER — Telehealth (HOSPITAL_COMMUNITY): Payer: Self-pay | Admitting: Pharmacist

## 2020-06-06 ENCOUNTER — Other Ambulatory Visit (HOSPITAL_COMMUNITY): Payer: Self-pay

## 2020-06-06 NOTE — Telephone Encounter (Signed)
Unable to reach patient, third and final attempt. 

## 2020-06-24 ENCOUNTER — Other Ambulatory Visit (HOSPITAL_COMMUNITY): Payer: Self-pay

## 2020-06-25 ENCOUNTER — Ambulatory Visit: Payer: Self-pay | Admitting: Cardiology

## 2020-06-30 ENCOUNTER — Encounter: Payer: Self-pay | Admitting: Cardiology

## 2020-06-30 ENCOUNTER — Other Ambulatory Visit: Payer: Self-pay

## 2020-06-30 NOTE — Progress Notes (Signed)
This encounter was created in error - please disregard.

## 2020-07-01 NOTE — Progress Notes (Deleted)
Cardiology Office Note:    Date:  07/01/2020   ID:  Jesse Wells, DOB 08-18-1965, MRN [id number]  PCP:  Pcp, No   [facility] Providers Cardiologist:  Fransico Him, MD {  Referring MD: Fatima Blank,*    History of Present Illness:    Jesse Wells is a 55 y.o. male with a hx of CAD s/p PCI to LAD on 05/29/20, HTN and HLD who presents to clinic for follow-up from his recent hospitalization.  Patient presented to [facility] hospital on 05/27/20 with worsening chest pain concerning for unstable angina found to have severe prox LAD stenosis s/p PCI on 05/28/20. TTE 05/28/20 with LVEF 50-55%, normal diastology, trivial MR, no other significant valve disease. He did well and was discharged on 05/28/20.  Today,   Past Medical History:  Diagnosis Date   Chest pain    Dyspnea on exertion    Hypertension    SOB (shortness of breath)     Past Surgical History:  Procedure Laterality Date   CORONARY STENT INTERVENTION N/A 05/28/2020   Procedure: CORONARY STENT INTERVENTION;  Surgeon: Sherren Mocha, MD;  Location: [place];  Service: Cardiovascular;  Laterality: N/A;   LEFT HEART CATH AND CORONARY ANGIOGRAPHY N/A 05/28/2020   Procedure: LEFT HEART CATH AND CORONARY ANGIOGRAPHY;  Surgeon: Sherren Mocha, MD;  Location: [facility];  Service: Cardiovascular;  Laterality: N/A;   NO PAST SURGERIES      Current Medications: No outpatient medications have been marked as taking for the 07/03/20 encounter (Appointment) with Freada Bergeron, MD.     Allergies:   Patient has no known allergies.   Social History   Socioeconomic History   Marital status: Married    Spouse name: Not on file   Number of children: Not on file   Years of education: Not on file   Highest education level: Not on file  Occupational History   Not on file  Tobacco Use   Smoking status: Current Every Day Smoker    Packs/day: 0.50   Smokeless tobacco: Not on file  Substance and Sexual  Activity   Alcohol use: Never   Drug use: Never   Sexual activity: Not on file  Other Topics Concern   Not on file  Social History Narrative   Not on file   Social Determinants of Health   Financial Resource Strain: Not on file  Food Insecurity: Not on file  Transportation Needs: Not on file  Physical Activity: Not on file  Stress: Not on file  Social Connections: Not on file     Family History: The patient's ***family history includes Diabetes in his father; Hypertension in his mother. There is no history of Heart disease.  ROS:   Please see the history of present illness.   All other systems reviewed and are negative.  EKGs/Labs/Other Studies Reviewed:    The following studies were reviewed today: Cath 05/28/20: 1. Left dominant coronary circulation 2. Severe proximal LAD stenosis, treated successfully with PCI using a 4.0x20 mm Synergy DES 3. Patent dominant LCx and patent nondominant RCA without significant stenoses Diagnostic Dominance: Left   Left Anterior Descending  There is severe proximal LAD stenosis. Otherwise the vessel is patent with no stenosis. The LAD wraps around the LV apex and supplies the distal inferior wall.  Prox LAD lesion is 85% stenosed. The lesion is eccentric.  Left Circumflex  The vessel exhibits minimal luminal irregularities.  Right Coronary Artery  The vessel exhibits minimal luminal  irregularities.    Intervention     Prox LAD lesion  Stent  CATH LAUNCHER 6FR EBU 3 guide catheter was inserted. Lesion crossed with guidewire using a WIRE COUGAR XT STRL 190CM. Pre-stent angioplasty was not performed. A drug-eluting stent was successfully placed using a SYNERGY XD 4.0X20. Maximum pressure: 14 atm. Post-stent angioplasty was performed using a BALLN [place] SAPPHIRE 4.5X15. Maximum pressure: 14 atm.  Post-Intervention Lesion Assessment  The intervention was successful. Pre-interventional TIMI flow is 3. Post-intervention TIMI flow is 3. No  complications occurred at this lesion.  There is a 0% residual stenosis post intervention.      Coronary Diagrams     Diagnostic Dominance: Left      Intervention            TTE 05/28/20: IMPRESSIONS   1. Inferior basal hypokinesis . Left ventricular ejection fraction, by  estimation, is 50 to 55%. The left ventricle has low normal function. The  left ventricle has no regional wall motion abnormalities. Left ventricular  diastolic parameters were normal.   2. Right ventricular systolic function is normal. The right ventricular  size is normal.   3. The mitral valve is normal in structure. Trivial mitral valve  regurgitation. No evidence of mitral stenosis.   4. The aortic valve is tricuspid. There is mild calcification of the  aortic valve. Aortic valve regurgitation is not visualized. Mild aortic  valve sclerosis is present, with no evidence of aortic valve stenosis.   5. The inferior vena cava is normal in size with greater than 50%  respiratory variability, suggesting right atrial pressure of 3 mmHg.     EKG:  EKG is *** ordered today.  The ekg ordered today demonstrates ***  Recent Labs: 05/27/2020: ALT 19 05/29/2020: BUN 14; Creatinine, Ser 1.20; Hemoglobin 13.5; Platelets 265; Potassium 3.2; Sodium 136  Recent Lipid Panel    Component Value Date/Time   CHOL 153 05/28/2020 0356   TRIG 65 05/28/2020 0356   HDL 40 (L) 05/28/2020 0356   CHOLHDL 3.8 05/28/2020 0356   VLDL 13 05/28/2020 0356   LDLCALC 100 (H) 05/28/2020 0356     Risk Assessment/Calculations:   {Does this patient have ATRIAL FIBRILLATION?:[phone]}   Physical Exam:    VS:  There were no vitals taken for this visit.    Wt Readings from Last 3 Encounters:  06/30/20 221 lb 12.8 oz (100.6 kg)  05/28/20 216 lb 7.9 oz (98.2 kg)     GEN: *** Well nourished, well developed in no acute distress HEENT: Normal NECK: No JVD; No carotid bruits LYMPHATICS: No lymphadenopathy CARDIAC: ***RRR, no  murmurs, rubs, gallops RESPIRATORY:  Clear to auscultation without rales, wheezing or rhonchi  ABDOMEN: Soft, non-tender, non-distended MUSCULOSKELETAL:  No edema; No deformity  SKIN: Warm and dry NEUROLOGIC:  Alert and oriented x 3 PSYCHIATRIC:  Normal affect   ASSESSMENT:    No diagnosis found. PLAN:    In order of problems listed above:  #Coronary Artery Disease s/p PCI to LAD: Patient presented with progressive exertional chest pain on 05/27/20 found to have prox LAD stenosis on cath on 05/28/20 now s/p successful PCI on 05/28/20. Doing well without anginal symptoms -Continue ASA and plavix 70m daily -Continue metop 12.539mXL daily -Continue losartan 2524maily -Cardiac rehab -Smoking cessation counseling   #HTN: -Continue losartan 78m10mily -Continue metop 12.5mg 7mdaily   #HLD: -Continue crestor 20mg 63my -Repeat lipids; goal LDL<70   #Tobacco Abuse: -Counseled extensively about the need to quit;  patient is motivated and will try nicotine patches at home   {Are you ordering a CV Procedure (e.g. stress test, cath, DCCV, TEE, etc)?   Press F2        :[id number]}    Medication Adjustments/Labs and Tests Ordered: Current medicines are reviewed at length with the patient today.  Concerns regarding medicines are outlined above.  No orders of the defined types were placed in this encounter.  No orders of the defined types were placed in this encounter.   There are no Patient Instructions on file for this visit.   Signed, Freada Bergeron, MD  07/01/2020 7:53 PM    [place]

## 2020-07-03 ENCOUNTER — Other Ambulatory Visit: Payer: Self-pay

## 2020-07-03 ENCOUNTER — Ambulatory Visit (INDEPENDENT_AMBULATORY_CARE_PROVIDER_SITE_OTHER): Payer: Self-pay | Admitting: Cardiology

## 2020-07-03 ENCOUNTER — Encounter: Payer: Self-pay | Admitting: Cardiology

## 2020-07-03 VITALS — BP 110/80 | HR 68 | Ht 65.0 in | Wt 222.4 lb

## 2020-07-03 DIAGNOSIS — G4733 Obstructive sleep apnea (adult) (pediatric): Secondary | ICD-10-CM

## 2020-07-03 DIAGNOSIS — I214 Non-ST elevation (NSTEMI) myocardial infarction: Secondary | ICD-10-CM

## 2020-07-03 DIAGNOSIS — I25110 Atherosclerotic heart disease of native coronary artery with unstable angina pectoris: Secondary | ICD-10-CM

## 2020-07-03 DIAGNOSIS — I10 Essential (primary) hypertension: Secondary | ICD-10-CM

## 2020-07-03 DIAGNOSIS — E785 Hyperlipidemia, unspecified: Secondary | ICD-10-CM

## 2020-07-03 LAB — LIPID PANEL
Chol/HDL Ratio: 2.9 ratio (ref 0.0–5.0)
Cholesterol, Total: 124 mg/dL (ref 100–199)
HDL: 43 mg/dL (ref >39)
LDL Chol Calc (NIH): 66 mg/dL (ref 0–99)
Triglycerides: 74 mg/dL (ref 0–149)
VLDL Cholesterol Cal: 15 mg/dL (ref 5–40)

## 2020-07-03 NOTE — Progress Notes (Signed)
Cardiology Office Note:    Date:  07/03/2020   ID:  Jesse Wells, DOB July 22, 1965, MRN [id number]  PCP:  Pcp, No   [facility] Providers Cardiologist:  Fransico Him, MD {  Referring MD: Fatima Blank,*    History of Present Illness:    Jesse Wells is a 55 y.o. male with a hx of CAD s/p PCI to LAD on 05/29/20, HTN and HLD who presents to clinic for follow-up from his recent hospitalization.  Patient presented to [facility] hospital on 05/27/20 with worsening chest pain concerning for unstable angina found to have severe prox LAD stenosis s/p PCI on 05/28/20. TTE 05/28/20 with LVEF 50-55%, normal diastology, trivial MR, no other significant valve disease. He did well and was discharged on 05/28/20.  Today he is feeling well. No recurrent anginal symptoms and is doing well with no chest pain, SOB, lightheadedness, LE edema, orthopnea, PND or dizziness. 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  He is complaint with his medications. He reports he has stopped smoking and made improvements to his diet.   His wife noticed he snores and occasionally stops breathing at night. She believes it to be associated with sleep apnea. Has not been tested before but is willing to do so.   Otherwise, he denies exertional shortness of breath, chest pain, palpitations, tightness, LE edema or pressure. He denies having PND, orthopnea, dizziness nor syncopal episodes.   Past Medical History:  Diagnosis Date   Chest pain    Dyspnea on exertion    Hypertension    SOB (shortness of breath)     Past Surgical History:  Procedure Laterality Date   CORONARY STENT INTERVENTION N/A 05/28/2020   Procedure: CORONARY STENT INTERVENTION;  Surgeon: Sherren Mocha, MD;  Location: [facility];  Service: Cardiovascular;  Laterality: N/A;   LEFT HEART CATH AND CORONARY ANGIOGRAPHY N/A 05/28/2020   Procedure: LEFT HEART CATH AND CORONARY ANGIOGRAPHY;  Surgeon: Sherren Mocha, MD;  Location:  [facility];  Service: Cardiovascular;  Laterality: N/A;   NO PAST SURGERIES      Current Medications: Current Meds  Medication Sig   aspirin 81 MG chewable tablet Chew 1 tablet (81 mg total) by mouth daily.   clopidogrel (PLAVIX) 75 MG tablet Take 1 tablet (75 mg total) by mouth daily with breakfast.   losartan (COZAAR) 25 MG tablet Take 1 tablet (25 mg total) by mouth daily.   metoprolol succinate (TOPROL-XL) 25 MG 24 hr tablet Take 1/2 tablet (12.5 mg total) by mouth daily.   nicotine (NICODERM CQ - DOSED IN MG/24 HOURS) 14 mg/24hr patch Place 1 patch (14 mg total) onto the skin daily.   rosuvastatin (CRESTOR) 20 MG tablet Take 1 tablet (20 mg total) by mouth daily.     Allergies:   Patient has no known allergies.   Social History   Socioeconomic History   Marital status: Married    Spouse name: Not on file   Number of children: Not on file   Years of education: Not on file   Highest education level: Not on file  Occupational History   Not on file  Tobacco Use   Smoking status: Former    Packs/day: 0.50    Pack years: 0.00    Types: Cigarettes   Smokeless tobacco: Never  Vaping Use   Vaping Use: Not on file  Substance and Sexual Activity   Alcohol use: Never   Drug use: Never  Sexual activity: Not on file  Other Topics Concern   Not on file  Social History Narrative   Not on file   Social Determinants of Health   Financial Resource Strain: Not on file  Food Insecurity: Not on file  Transportation Needs: Not on file  Physical Activity: Not on file  Stress: Not on file  Social Connections: Not on file     Family History: The patient's family history includes Diabetes in his father; Hypertension in his mother. There is no history of Heart disease.  ROS:   Review of Systems  Constitutional:  Negative for chills, diaphoresis, fever and malaise/fatigue.  HENT:  Negative for sore throat.   Respiratory:  Negative for cough and shortness of breath.    Cardiovascular:  Negative for chest pain, palpitations, orthopnea, claudication, leg swelling and PND.  Gastrointestinal:  Negative for abdominal pain, blood in stool, constipation and diarrhea.  Genitourinary:  Negative for dysuria.  Musculoskeletal:  Negative for joint pain.  Skin:  Negative for rash.  Neurological:  Negative for dizziness, loss of consciousness and headaches.    EKGs/Labs/Other Studies Reviewed:    The following studies were reviewed today: Cath 05/28/20: 1. Left dominant coronary circulation 2. Severe proximal LAD stenosis, treated successfully with PCI using a 4.0x20 mm Synergy DES 3. Patent dominant LCx and patent nondominant RCA without significant stenoses Diagnostic Dominance: Left   Left Anterior Descending  There is severe proximal LAD stenosis. Otherwise the vessel is patent with no stenosis. The LAD wraps around the LV apex and supplies the distal inferior wall.  Prox LAD lesion is 85% stenosed. The lesion is eccentric.  Left Circumflex  The vessel exhibits minimal luminal irregularities.  Right Coronary Artery  The vessel exhibits minimal luminal irregularities.    Intervention     Prox LAD lesion  Stent  CATH LAUNCHER 6FR EBU 3 guide catheter was inserted. Lesion crossed with guidewire using a WIRE COUGAR XT STRL 190CM. Pre-stent angioplasty was not performed. A drug-eluting stent was successfully placed using a SYNERGY XD 4.0X20. Maximum pressure: 14 atm. Post-stent angioplasty was performed using a BALLN [place] SAPPHIRE 4.5X15. Maximum pressure: 14 atm.  Post-Intervention Lesion Assessment  The intervention was successful. Pre-interventional TIMI flow is 3. Post-intervention TIMI flow is 3. No complications occurred at this lesion.  There is a 0% residual stenosis post intervention.      Coronary Diagrams     Diagnostic Dominance: Left      Intervention            TTE 05/28/20: IMPRESSIONS   1. Inferior basal hypokinesis . Left  ventricular ejection fraction, by  estimation, is 50 to 55%. The left ventricle has low normal function. The  left ventricle has no regional wall motion abnormalities. Left ventricular  diastolic parameters were normal.   2. Right ventricular systolic function is normal. The right ventricular  size is normal.   3. The mitral valve is normal in structure. Trivial mitral valve  regurgitation. No evidence of mitral stenosis.   4. The aortic valve is tricuspid. There is mild calcification of the  aortic valve. Aortic valve regurgitation is not visualized. Mild aortic  valve sclerosis is present, with no evidence of aortic valve stenosis.   5. The inferior vena cava is normal in size with greater than 50%  respiratory variability, suggesting right atrial pressure of 3 mmHg.    EKG:  No new ECG  Recent Labs: 05/27/2020: ALT 19 05/29/2020: BUN 14; Creatinine, Ser 1.20; Hemoglobin  13.5; Platelets 265; Potassium 3.2; Sodium 136  Recent Lipid Panel    Component Value Date/Time   CHOL 153 05/28/2020 0356   TRIG 65 05/28/2020 0356   HDL 40 (L) 05/28/2020 0356   CHOLHDL 3.8 05/28/2020 0356   VLDL 13 05/28/2020 0356   LDLCALC 100 (H) 05/28/2020 0356     Physical Exam:    VS:  BP 110/80   Pulse 68   Ht '5\' 5"'  (1.651 m)   Wt 222 lb 6.4 oz (100.9 kg)   SpO2 98%   BMI 37.01 kg/m     Wt Readings from Last 3 Encounters:  07/03/20 222 lb 6.4 oz (100.9 kg)  06/30/20 221 lb 12.8 oz (100.6 kg)  05/28/20 216 lb 7.9 oz (98.2 kg)     GEN: Well nourished, well developed in no acute distress HEENT: Normal NECK: No JVD; No carotid bruits LYMPHATICS: No lymphadenopathy CARDIAC: RRR, no murmurs, rubs, gallops RESPIRATORY:  Clear to auscultation without rales, wheezing or rhonchi  ABDOMEN: Soft, non-tender, non-distended MUSCULOSKELETAL:  No edema; No deformity  SKIN: Warm and dry NEUROLOGIC:  Alert and oriented x 3 PSYCHIATRIC:  Normal affect   ASSESSMENT:    1. Coronary artery disease  involving native coronary artery of native heart with unstable angina pectoris ([place])   2. Primary hypertension   3. Non-ST elevation (NSTEMI) myocardial infarction ([place])   4. Hyperlipidemia, unspecified hyperlipidemia type   5. OSA (obstructive sleep apnea)    PLAN:    In order of problems listed above:  #Coronary Artery Disease s/p PCI to LAD: Patient presented with progressive exertional chest pain on 05/27/20 found to have prox LAD stenosis on cath on 05/28/20 now s/p successful PCI on 05/28/20. Doing well without anginal symptoms -Continue ASA and plavix 11m daily -Continue metop 12.51mXL daily -Continue losartan 2559maily -Refer to cardiac rehab -Successfully quit smoking!!!   #HTN: Very well controlled at home and at goal <120s/80s. -Continue losartan 52m48mily -Continue metop 12.5mg 74mdaily   #HLD: -Continue crestor 20mg 93my -Repeat lipids; goal LDL<70   #History of Tobacco Abuse: -Has successfully quit!!  #Snoring: Patient with apneic episodes witnessed by his wife at night. No diagnosis of OSA. -Check sleep study   Medication Adjustments/Labs and Tests Ordered: Current medicines are reviewed at length with the patient today.  Concerns regarding medicines are outlined above.  Orders Placed This Encounter  Procedures   Lipid Profile   AMB referral to cardiac rehabilitation   Split night study   No orders of the defined types were placed in this encounter.   Patient Instructions  Medication Instructions:   Your physician recommends that you continue on your current medications as directed. Please refer to the Current Medication list given to you today.  *If you need a refill on your cardiac medications before your next appointment, please call your pharmacy*   Lab Work:  TODAY--LIPIDS  If you have labs (blood work) drawn today and your tests are completely normal, you will receive your results only by: [facility] have MyChart) OR A  paper copy in the mail If you have any lab test that is abnormal or we need to change your treatment, we will call you to review the results.   TESTING: Your physician has recommended that you have a sleep study. This test records several body functions during sleep, including: brain activity, eye movement, oxygen and carbon dioxide blood levels, heart rate and rhythm, breathing rate and rhythm, the flow  of air through your mouth and nose, snoring, body muscle movements, and chest and belly movement. YOU WILL GET A CALL BACK FROM OUR SLEEP STUDY COORDINATOR NINA JONES TO ARRANGE THIS TEST   You have been referred to CARDIAC REHAB AT [place] NSTEMI   Follow-Up:  6 MONTHS IN THE OFFICE WITH DR. Johney Frame   I,Essence Turner,acting as a scribe for Freada Bergeron, MD.,have documented all relevant documentation on the behalf of Freada Bergeron, MD,as directed by  Freada Bergeron, MD while in the presence of Freada Bergeron, MD.   I, Freada Bergeron, MD, have reviewed all documentation for this visit. The documentation on 07/03/20 for the exam, diagnosis, procedures, and orders are all accurate and complete.   Signed, Freada Bergeron, MD  07/03/2020 11:22 AM    [facility]

## 2020-07-03 NOTE — Patient Instructions (Addendum)
Medication Instructions:   Your physician recommends that you continue on your current medications as directed. Please refer to the Current Medication list given to you today.  *If you need a refill on your cardiac medications before your next appointment, please call your pharmacy*   Lab Work:  TODAY--LIPIDS  If you have labs (blood work) drawn today and your tests are completely normal, you will receive your results only by: MyChart Message (if you have MyChart) OR A paper copy in the mail If you have any lab test that is abnormal or we need to change your treatment, we will call you to review the results.   TESTING: Your physician has recommended that you have a sleep study. This test records several body functions during sleep, including: brain activity, eye movement, oxygen and carbon dioxide blood levels, heart rate and rhythm, breathing rate and rhythm, the flow of air through your mouth and nose, snoring, body muscle movements, and chest and belly movement. YOU WILL GET A CALL BACK FROM OUR SLEEP STUDY COORDINATOR NINA JONES TO ARRANGE THIS TEST   You have been referred to CARDIAC REHAB AT [place] FOR NSTEMI   Follow-Up:  6 MONTHS IN THE OFFICE WITH DR. Shari Prows

## 2020-07-07 ENCOUNTER — Telehealth: Payer: Self-pay | Admitting: *Deleted

## 2020-07-07 NOTE — Telephone Encounter (Signed)
-----   Message from Loa Socks, LPN sent at 07/27/7104 11:04 AM EDT ----- Regarding: SPLIT NIGHT SLEEP STUDY PER [place] Dr. Shari Prows wants this pt to have a sleep study done in the lab for OSA.  Order is in and pt is aware of the process and you will call to arrange this appt.  Can you please arrange and let me know once done?  Thanks, [employer]

## 2020-07-08 ENCOUNTER — Other Ambulatory Visit: Payer: Self-pay

## 2020-07-08 ENCOUNTER — Telehealth: Payer: Self-pay

## 2020-07-08 MED ORDER — ROSUVASTATIN CALCIUM 20 MG PO TABS: 20.0000 mg | ORAL_TABLET | Freq: Every day | ORAL | 5 refills | Status: DC

## 2020-07-08 NOTE — Telephone Encounter (Signed)
Received call from patients wife requesting a refill on Crestor. She states the pharmacy sent over a request but has not heard back from our office.   I advised her that I would send the rx over to the pharmacy. She voiced understanding.   Rx(s) sent to pharmacy electronically.

## 2020-07-08 NOTE — Telephone Encounter (Signed)
Called pt to informed him of an appt for his sleep study. I was not able to reach pt or leave a message due to his mailbox not being set up.

## 2020-07-09 ENCOUNTER — Encounter: Payer: Self-pay | Admitting: *Deleted

## 2020-07-11 NOTE — Telephone Encounter (Signed)
Patient is scheduled for lab study on 8/16. Patient understands his sleep study will be done at [facility] sleep lab. Patient understands he will receive a sleep packet in a week or so. Patient understands to call if he does not receive the sleep packet in a timely manner.   Patient agrees with treatment and thanked me for call. 

## 2020-07-11 NOTE — Telephone Encounter (Signed)
Patient is scheduled for lab study on 8/16. Patient understands his sleep study will be done at [facility] sleep lab. Patient understands he will receive a sleep packet in a week or so. Patient understands to call if he does not receive the sleep packet in a timely manner.   Patient agrees with treatment and thanked me for call.

## 2020-07-21 ENCOUNTER — Other Ambulatory Visit: Payer: Self-pay

## 2020-07-21 MED ORDER — CLOPIDOGREL BISULFATE 75 MG PO TABS: 75.0000 mg | ORAL_TABLET | Freq: Every day | ORAL | 1 refills | Status: DC

## 2020-07-21 MED ORDER — LOSARTAN POTASSIUM 25 MG PO TABS: 25.0000 mg | ORAL_TABLET | Freq: Every day | ORAL | 1 refills | Status: DC

## 2020-07-23 ENCOUNTER — Telehealth (HOSPITAL_COMMUNITY): Payer: Self-pay

## 2020-07-23 NOTE — Telephone Encounter (Signed)
Called pt to see if he was interested in scheduling for cardiac rehab pt stated that he was interested. I asked pt did he have insurance pt stated that he has insurance but he doesn't have his insurance card yet. I asked pt what insurance company pt stated that he was unsure that he would call back when he got home to give that information. Will wait on pt to call so I can document his insurance.

## 2020-08-27 ENCOUNTER — Telehealth (HOSPITAL_COMMUNITY): Payer: Self-pay

## 2020-08-27 ENCOUNTER — Encounter (HOSPITAL_COMMUNITY): Payer: Self-pay

## 2020-08-27 NOTE — Telephone Encounter (Signed)
Pt insurance is active and benefits verified through [employer] Co-pay 0, DED $1,500/$447.34 met, out of pocket $8,150/$447.34 met, co-insurance 20%. no pre-authorization required, Sheila/[employer] 08/27/2020'@9' :57am, REF# [id number]   Will contact patient to see if he is interested in the Cardiac Rehab Program. If interested, patient will need to complete follow up appt. Once completed, patient will be contacted for scheduling upon review by the RN Navigator.

## 2020-08-27 NOTE — Telephone Encounter (Signed)
Attempted to call patient in regards to Cardiac Rehab - LM on VM Mailed letter 

## 2020-09-09 ENCOUNTER — Encounter (HOSPITAL_BASED_OUTPATIENT_CLINIC_OR_DEPARTMENT_OTHER): Payer: Self-pay | Admitting: Cardiology

## 2020-09-15 NOTE — Telephone Encounter (Signed)
No response from pt in regards to cardiac rehab. Closed referral 

## 2020-09-24 ENCOUNTER — Telehealth: Payer: Self-pay | Admitting: Cardiology

## 2020-09-24 MED ORDER — METOPROLOL SUCCINATE ER 25 MG PO TB24: 12.5000 mg | ORAL_TABLET | Freq: Every day | ORAL | 2 refills | Status: DC

## 2020-09-24 NOTE — Telephone Encounter (Signed)
*  STAT* If patient is at the pharmacy, call can be transferred to refill team.   1. Which medications need to be refilled? (please list name of each medication and dose if known)  metoprolol succinate (TOPROL-XL) 25 MG 24 hr tablet  2. Which pharmacy/location (including street and city if local pharmacy) is medication to be sent to? [id number] - [employer] ([place]), [place] - [address]  3. Do they need a 30 day or 90 day supply? 30 day supply

## 2020-09-24 NOTE — Telephone Encounter (Signed)
Pt's medication was sent to pt's pharmacy as requested. Confirmation received.  °

## 2020-11-17 ENCOUNTER — Encounter (HOSPITAL_BASED_OUTPATIENT_CLINIC_OR_DEPARTMENT_OTHER): Payer: Self-pay

## 2020-11-17 ENCOUNTER — Emergency Department (HOSPITAL_BASED_OUTPATIENT_CLINIC_OR_DEPARTMENT_OTHER)
Admission: EM | Admit: 2020-11-17 | Discharge: 2020-11-18 | Disposition: A | Discharge status: Home / Self Care | Payer: 59 | Attending: Emergency Medicine | Admitting: Emergency Medicine

## 2020-11-17 ENCOUNTER — Other Ambulatory Visit: Payer: Self-pay

## 2020-11-17 ENCOUNTER — Emergency Department (HOSPITAL_COMMUNITY): Admission: EM | Admit: 2020-11-17 | Discharge: 2020-11-17 | Discharge status: Against Medical Advice | Payer: 59

## 2020-11-17 DIAGNOSIS — Z87891 Personal history of nicotine dependence: Secondary | ICD-10-CM | POA: Insufficient documentation

## 2020-11-17 DIAGNOSIS — Z7982 Long term (current) use of aspirin: Secondary | ICD-10-CM | POA: Insufficient documentation

## 2020-11-17 DIAGNOSIS — Z79899 Other long term (current) drug therapy: Secondary | ICD-10-CM | POA: Insufficient documentation

## 2020-11-17 DIAGNOSIS — I119 Hypertensive heart disease without heart failure: Secondary | ICD-10-CM | POA: Insufficient documentation

## 2020-11-17 DIAGNOSIS — I2510 Atherosclerotic heart disease of native coronary artery without angina pectoris: Secondary | ICD-10-CM | POA: Insufficient documentation

## 2020-11-17 DIAGNOSIS — R040 Epistaxis: Secondary | ICD-10-CM | POA: Insufficient documentation

## 2020-11-17 HISTORY — DX: Atherosclerotic heart disease of native coronary artery without angina pectoris: I25.10

## 2020-11-17 NOTE — ED Provider Notes (Signed)
[facility] Provider Note: Lowella Dell, MD, FACEP  CSN: [id number] MRN: [id number] ARRIVAL: 11/17/20 at 2145 ROOM: DB012/DB012   CHIEF COMPLAINT  Epistaxis   HISTORY OF PRESENT ILLNESS  11/17/20 11:22 PM Darrian Grzelak is a 55 y.o. male on clopidogrel for coronary artery disease.  He is here with right epistaxis that began this morning about 8 AM.  It bled for about an hour and then stopped on its own.  He then had a recurrence about 8 PM which bled until about a half an hour ago.  The bleeding was severe enough that it was "squirting out" and he was spitting out blood clots.  He denies any trauma to his nose.  He has does have a headache which is not severe.  His blood pressure is noted to be normal.   Past Medical History:  Diagnosis Date   CAD (coronary artery disease)    Dyspnea on exertion    Hypertension    SOB (shortness of breath)     Past Surgical History:  Procedure Laterality Date   CORONARY STENT INTERVENTION N/A 05/28/2020   Procedure: CORONARY STENT INTERVENTION;  Surgeon: Tonny Bollman, MD;  Location: [facility] INVASIVE CV LAB;  Service: Cardiovascular;  Laterality: N/A;   LEFT HEART CATH AND CORONARY ANGIOGRAPHY N/A 05/28/2020   Procedure: LEFT HEART CATH AND CORONARY ANGIOGRAPHY;  Surgeon: Tonny Bollman, MD;  Location: [facility] INVASIVE CV LAB;  Service: Cardiovascular;  Laterality: N/A;   NO PAST SURGERIES      Family History  Problem Relation Age of Onset   Hypertension Mother    Diabetes Father    Heart disease Neg Hx     Social History   Tobacco Use   Smoking status: Former    Packs/day: 0.50    Types: Cigarettes   Smokeless tobacco: Never  Substance Use Topics   Alcohol use: Never   Drug use: Never    Prior to Admission medications   Medication Sig Start Date End Date Taking? Authorizing Provider  aspirin 81 MG chewable tablet Chew 1 tablet (81 mg total) by mouth daily. 05/08/20   Nira Conn, MD  clopidogrel (PLAVIX) 75 MG  tablet Take 1 tablet (75 mg total) by mouth daily with breakfast. 07/21/20   Meriam Sprague, MD  losartan (COZAAR) 25 MG tablet Take 1 tablet (25 mg total) by mouth daily. 07/21/20   Meriam Sprague, MD  metoprolol succinate (TOPROL-XL) 25 MG 24 hr tablet Take 1/2 tablet (12.5 mg total) by mouth daily. 09/24/20   Meriam Sprague, MD  nicotine (NICODERM CQ - DOSED IN MG/24 HOURS) 14 mg/24hr patch Place 1 patch (14 mg total) onto the skin daily. 05/29/20   Joseph Art, DO  rosuvastatin (CRESTOR) 20 MG tablet Take 1 tablet (20 mg total) by mouth daily. 07/08/20   Meriam Sprague, MD    Allergies Patient has no known allergies.   REVIEW OF SYSTEMS  Negative except as noted here or in the History of Present Illness.   PHYSICAL EXAMINATION  Initial Vital Signs Blood pressure 122/80, pulse 69, temperature 98.3 F (36.8 C), resp. rate 18, height 5\' 7"  (1.702 m), weight 97.5 kg, SpO2 100 %.  Examination General: Well-developed, well-nourished male in no acute distress; appearance consistent with age of record HENT: normocephalic; atraumatic; clotted blood on right anterior nasal septum, no active bleeding Eyes: Normal appearance Neck: supple Heart: regular rate and rhythm Lungs: clear to auscultation bilaterally Abdomen: soft; nondistended; nontender; bowel sounds present  Extremities: No deformity; full range of motion; trace edema of lower legs Neurologic: Awake, alert and oriented; motor function intact in all extremities and symmetric; no facial droop Skin: Warm and dry Psychiatric: Normal mood and affect   RESULTS  Summary of this visit's results, reviewed and interpreted by myself:   EKG Interpretation  Date/Time:    Ventricular Rate:    PR Interval:    QRS Duration:   QT Interval:    QTC Calculation:   R Axis:     Text Interpretation:         Laboratory Studies: No results found for this or any previous visit (from the past 24 hour(s)). Imaging  Studies: No results found.  ED COURSE and MDM  Nursing notes, initial and subsequent vitals signs, including pulse oximetry, reviewed and interpreted by myself.  Vitals:   11/17/20 2251 11/17/20 2300 11/17/20 2334 11/18/20 0000  BP: (!) 104/91 122/80 136/86 (!) 150/84  Pulse: 74 69 71 62  Resp: 18 18 18 18   Temp:      SpO2: 100% 100% 100% 100%  Weight:      Height:       Medications  oxymetazoline (AFRIN) 0.05 % nasal spray 3 spray (has no administration in time range)    12:27 AM Patient's nose remains hemostatic.  We will provide Afrin for application if bleeding recurs.  PROCEDURES  .Epistaxis Management  Date/Time: 11/17/2020 11:34 PM Performed by: Jamicah Anstead, MD Authorized by: Alima Naser, MD   Consent:    Consent obtained:  Verbal   Risks discussed:  Bleeding and pain Anesthesia:    Anesthesia method:  None Procedure details:    Treatment site:  R anterior   Treatment method:  Silver nitrate   Treatment complexity:  Limited   Treatment episode: initial   Post-procedure details:    Assessment:  Bleeding stopped   Procedure completion:  Tolerated   ED DIAGNOSES     ICD-10-CM   1. Acute anterior epistaxis  R04.0          Grete Bosko, 11/19/2020, MD 11/18/20 [phone]

## 2020-11-17 NOTE — ED Triage Notes (Signed)
Pt reports active nose bleeds and spitting out blood clots.  C/o headache -  denies recent nasal surgery / trauma.  Pt states takes blood thinners and he had heart stent in months ago - denies CP / SOB.   Pt GCS 15 and came in  ambulatory

## 2020-11-17 NOTE — ED Notes (Signed)
Patient left on own accord °

## 2020-11-18 ENCOUNTER — Telehealth: Payer: Self-pay | Admitting: Cardiology

## 2020-11-18 DIAGNOSIS — R040 Epistaxis: Secondary | ICD-10-CM

## 2020-11-18 DIAGNOSIS — I25110 Atherosclerotic heart disease of native coronary artery with unstable angina pectoris: Secondary | ICD-10-CM

## 2020-11-18 MED ORDER — OXYMETAZOLINE HCL 0.05 % NA SOLN: 3.0000 | NASAL | Status: DC | PRN

## 2020-11-18 NOTE — Telephone Encounter (Signed)
[facility] Scheduler as well as [facility] was able to make contact with the pts wife about urgent referral to them. Pt will see a Provider at [facility] today at 4:15 pm.  Pt wife confirmed appt time and she will be bringing the pt.   Pt will be seeing Dr. Cheron Schaumann at [facility] today at 4:15 pm.

## 2020-11-18 NOTE — Telephone Encounter (Signed)
Attempted to call the pt and wife back x 4 and they did not answer.  Did leave a detailed message for either one of them to return a callback to the office, so we can discuss urgent referral to ENT, as advised by Dr. Mayford Knife (covering for Dr. Shari Prows this week) in this message.  Went ahead and placed urgent referral to [facility]NT Dr. Jenne Pane, for the pt.   Will await return call back from the pt, to discuss plan.

## 2020-11-18 NOTE — Telephone Encounter (Signed)
   Pt c/o medication issue:  1. Name of Medication:  aspirin 81 MG chewable tablet  clopidogrel (PLAVIX) 75 MG tablet  2. How are you currently taking this medication (dosage and times per day)? As directed  3. Are you having a reaction (difficulty breathing--STAT)? Nosebleeds   4. What is your medication issue? Wife is concerned because the patient has been having nosebleeds since yesterday. Sometimes large clots come out. The patient went to the ED yesterday and was told to follow up with his MD asap  Wife called, but she has to go to work. Please call the patient

## 2020-11-18 NOTE — Telephone Encounter (Signed)
Pt and wife calling to follow-up from where the pt went to the ED yesterday for nosebleed.   Pt was seen in the ED yesterday for right epistaxis that began around 8 am yesterday morning. Pt is on both ASA and Plavix.  Pt reported to ED that his nosebleed occurred for about an hour then it stopped.  It then reoccurred again around 8 pm which then led him to the ED for evaluation of this.  Pt reported to the ED that his nose did "squirt out" when it bled, and caused him to spit the contents up. He denies any kind of trauma to his face and nose.  His pressures have been normal and was normal in the ED at  104/91 HR-74.  He mentioned a mild headache to the ER MD, but not severe.  Wife and pt state that the ER MD was able to stop his nosebleed and  advised the pt to use afrin as needed if bleeding recurs. Pt and wife stated that they were told to follow-up with his Medical Doctor today for this, which is why pt is calling our office today for further advisement.   Pt does not have a PCP on file and when reading discharge note from the ER MD, he was advised the pt to follow-up with Dr. Shari Prows as scheduled in Dec and follow-up with his PCP (does not have one) now for nosebleed.   No meds or referral to ENT was ordered on this pt from discharge at the ER.  Pt was given several instructions at discharge on how to manage a nosebleed and things to avoid and discuss with your Provider, if a nosebleed reoccurs.   Pt is asking for advisement on what he should do to manage and/or does any of his meds need to be changed, stopped, or have dose adjusted.   Will forward this message to Dr. Devin Going in-basket for covering MD to further review and advise on this matter.  Will also include our PharmD team, to get their input on this as well.  Will follow-up with the pt accordingly thereafter.

## 2020-11-18 NOTE — Telephone Encounter (Signed)
[place], [place] - 11/18/2020 11:57 AM Quintella Reichert, MD  Sent: Tue November 18, 2020  2:22 PM  To: Loa Socks, LPN; Meriam Sprague, MD  Cc: Vida Rigger Div Pharmd          Message  He had a PCI done in May 2022 so still needs to remain on DAPT for now.  Please get him in ASAP to [facility] telling them that he is on DAPT for a stent this year and is having epistaxis and was seen in the ER last night and was supposed to follow-up.  ----- Message -----

## 2020-11-18 NOTE — Telephone Encounter (Signed)
Spoke with both the pt and wife.  Informed them both that I have placed an urgent referral to [facility] for the pt to be seen for nosebleeds and being on DAPT for recent stent back in May 2022. Informed the pt that I will have our [facility] Schedulers call their office now so that they can assist in getting him an urgent appt. Both verbalized understanding and agrees with this plan.

## 2020-12-24 NOTE — Progress Notes (Signed)
Cardiology Office Note:    Date:  01/06/2021   ID:  [name], DOB [birth year], MRN [id number]  PCP:  [facility] [place] Prov[facility] Cardiologist:  Fransico Him, MD {  Referring MD: No ref. provider found    History of Present Illness:    Jesse Wells is a 55 y.o. male with a hx of CAD s/p PCI to LAD on 05/29/20, HTN and HLD who presents to clinic for follow-up of his CAD.  Patient presented to [facility] hospital on 05/27/20 with worsening chest pain concerning for unstable angina found to have severe prox LAD stenosis s/p PCI on 05/28/20. TTE 05/28/20 with LVEF 50-55%, normal diastology, trivial MR, no other significant valve disease. He did well and was discharged on 05/28/20.  During last visit on 07/03/20, he was doing well with no anginal symptoms. Compliant with medications.  Today, the patient states he is doing overall well. No chest pain and only occasional SOB with significant exertion. Has not been needing his nitro. Otherwise, has been compliant with all medications. Cut back on tobacco use to 2 cigarettes daily (from 1.5ppd). Has been adhering to a healthy diet as much as possible. No LE edema, orthopnea, PND, palpitations, nausea or vomiting.   Past Medical History:  Diagnosis Date   CAD (coronary artery disease)    Dyspnea on exertion    Hypertension    SOB (shortness of breath)     Past Surgical History:  Procedure Laterality Date   CORONARY STENT INTERVENTION N/A 05/28/2020   Procedure: CORONARY STENT INTERVENTION;  Surgeon: Sherren Mocha, MD;  Location: [place] CV LAB;  Service: Cardiovascular;  Laterality: N/A;   LEFT HEART CATH AND CORONARY ANGIOGRAPHY N/A 05/28/2020   Procedure: LEFT HEART CATH AND CORONARY ANGIOGRAPHY;  Surgeon: Sherren Mocha, MD;  Location: [place] CV LAB;  Service: Cardiovascular;  Laterality: N/A;   NO PAST SURGERIES      Current Medications: Current Meds  Medication Sig   aspirin 81 MG chewable  tablet Chew 1 tablet (81 mg total) by mouth daily.   ezetimibe (ZETIA) 10 MG tablet Take 1 tablet (10 mg total) by mouth daily.   metoprolol succinate (TOPROL-XL) 25 MG 24 hr tablet Take 1/2 tablet (12.5 mg total) by mouth daily.   Multiple Vitamins-Minerals (ONE-A-DAY MENS 50+ PO) Take 1 tablet by mouth daily.   nicotine (NICODERM CQ - DOSED IN MG/24 HOURS) 14 mg/24hr patch Place 1 patch (14 mg total) onto the skin daily.   rosuvastatin (CRESTOR) 20 MG tablet Take 1 tablet (20 mg total) by mouth daily.   [DISCONTINUED] clopidogrel (PLAVIX) 75 MG tablet Take 1 tablet (75 mg total) by mouth daily with breakfast.   [DISCONTINUED] losartan (COZAAR) 25 MG tablet Take 1 tablet (25 mg total) by mouth daily.     Allergies:   Patient has no known allergies.   Social History   Socioeconomic History   Marital status: Married    Spouse name: Not on file   Number of children: Not on file   Years of education: Not on file   Highest education level: Not on file  Occupational History   Not on file  Tobacco Use   Smoking status: Former    Packs/day: 0.50    Types: Cigarettes   Smokeless tobacco: Never  Vaping Use   Vaping Use: Not on file  Substance and Sexual Activity   Alcohol use: Never   Drug use: Never   Sexual activity: Not on  file  Other Topics Concern   Not on file  Social History Narrative   Not on file   Social Determinants of Health   Financial Resource Strain: Not on file  Food Insecurity: Not on file  Transportation Needs: Not on file  Physical Activity: Not on file  Stress: Not on file  Social Connections: Not on file     Family History: The patient's family history includes Diabetes in his father; Hypertension in his mother. There is no history of Heart disease.  ROS:   Review of Systems  Constitutional:  Negative for chills, diaphoresis, fever and malaise/fatigue.  HENT:  Negative for sore throat.   Respiratory:  Positive for shortness of breath. Negative for  cough.   Cardiovascular:  Negative for chest pain, palpitations, orthopnea, claudication, leg swelling and PND.  Gastrointestinal:  Negative for blood in stool and constipation.  Genitourinary:  Negative for dysuria.  Skin:  Negative for rash.  Neurological:  Negative for dizziness and loss of consciousness.    EKGs/Labs/Other Studies Reviewed:    The following studies were reviewed today: Cath 05/28/20: 1. Left dominant coronary circulation 2. Severe proximal LAD stenosis, treated successfully with PCI using a 4.0x20 mm Synergy DES 3. Patent dominant LCx and patent nondominant RCA without significant stenoses Diagnostic Dominance: Left   Left Anterior Descending  There is severe proximal LAD stenosis. Otherwise the vessel is patent with no stenosis. The LAD wraps around the LV apex and supplies the distal inferior wall.  Prox LAD lesion is 85% stenosed. The lesion is eccentric.  Left Circumflex  The vessel exhibits minimal luminal irregularities.  Right Coronary Artery  The vessel exhibits minimal luminal irregularities.    Intervention     Prox LAD lesion  Stent  CATH LAUNCHER 6FR EBU 3 guide catheter was inserted. Lesion crossed with guidewire using a WIRE COUGAR XT STRL 190CM. Pre-stent angioplasty was not performed. A drug-eluting stent was successfully placed using a SYNERGY XD 4.0X20. Maximum pressure: 14 atm. Post-stent angioplasty was performed using a BALLN [place] SAPPHIRE 4.5X15. Maximum pressure: 14 atm.  Post-Intervention Lesion Assessment  The intervention was successful. Pre-interventional TIMI flow is 3. Post-intervention TIMI flow is 3. No complications occurred at this lesion.  There is a 0% residual stenosis post intervention.      Coronary Diagrams     Diagnostic Dominance: Left      Intervention            TTE 05/28/20: IMPRESSIONS   1. Inferior basal hypokinesis . Left ventricular ejection fraction, by  estimation, is 50 to 55%. The left ventricle  has low normal function. The  left ventricle has no regional wall motion abnormalities. Left ventricular  diastolic parameters were normal.   2. Right ventricular systolic function is normal. The right ventricular  size is normal.   3. The mitral valve is normal in structure. Trivial mitral valve  regurgitation. No evidence of mitral stenosis.   4. The aortic valve is tricuspid. There is mild calcification of the  aortic valve. Aortic valve regurgitation is not visualized. Mild aortic  valve sclerosis is present, with no evidence of aortic valve stenosis.   5. The inferior vena cava is normal in size with greater than 50%  respiratory variability, suggesting right atrial pressure of 3 mmHg.    EKG:  No new ECG  Recent Labs: 05/27/2020: ALT 19 05/29/2020: BUN 14; Creatinine, Ser 1.20; Hemoglobin 13.5; Platelets 265; Potassium 3.2; Sodium 136  Recent Lipid Panel  Component Value Date/Time   CHOL 124 07/03/2020 1123   TRIG 74 07/03/2020 1123   HDL 43 07/03/2020 1123   CHOLHDL 2.9 07/03/2020 1123   CHOLHDL 3.8 05/28/2020 0356   VLDL 13 05/28/2020 0356   LDLCALC 66 07/03/2020 1123     Physical Exam:    VS:  BP 128/80   Pulse (!) 58   Ht _0  (1.702 m)   Wt 220 lb (99.8 kg)   SpO2 96%   BMI 34.46 kg/m     Wt Readings from Last 3 Encounters:  01/06/21 220 lb (99.8 kg)  11/17/20 215 lb (97.5 kg)  07/03/20 222 lb 6.4 oz (100.9 kg)     GEN: Well nourished, well developed in no acute distress HEENT: Normal NECK: No JVD; No carotid bruits CARDIAC: RRR, no murmurs, rubs, gallops RESPIRATORY:  CTAB, no wheezes ABDOMEN: Soft, non-tender, non-distended MUSCULOSKELETAL:  No edema; No deformity  SKIN: Warm and dry NEUROLOGIC:  Alert and oriented x 3 PSYCHIATRIC:  Normal affect   ASSESSMENT:    1. Coronary artery disease involving native coronary artery of native heart without angina pectoris   2. Medication management   3. Hyperlipidemia, unspecified hyperlipidemia type    4. Primary hypertension   5. Non-ST elevation (NSTEMI) myocardial infarction ([place])   6. Tobacco abuse    PLAN:    In order of problems listed above:  #Coronary Artery Disease s/p PCI to LAD: #History of NSTEMI: Patient presented with progressive exertional chest pain on 05/27/20 found to have prox LAD stenosis on cath on 05/28/20 now s/p successful PCI on 05/28/20. Doing well without anginal symptoms. Compliant with all home medications.  -Continue ASA and plavix 53m daily; will plan for plavix alone after 1 year -Continue metop 12.534mXL daily -Continue losartan 2584maily -Down to 2 cigarettes per day; encouraged cessation   #HTN: Very well controlled at home and at goal <120s/80s. -Continue losartan 77m87mily -Continue metop 12.5mg 30mdaily   #HLD: LDL 66. Goal now <55. -Continue crestor 20mg 63my -Start zetia 10mg d20m -Goal <55   #Tobacco Abuse: -Cutting back significantly to 2 cigarettes per day; encouraged cessation  #Snoring: Patient with apneic episodes witnessed by his wife at night. No diagnosis of OSA. -Did not show for sleep study -Will re-discuss at next visit   Medication Adjustments/Labs and Tests Ordered: Current medicines are reviewed at length with the patient today.  Concerns regarding medicines are outlined above.  Orders Placed This Encounter  Procedures   Lipid Profile   EKG 12-Lead   Meds ordered this encounter  Medications   clopidogrel (PLAVIX) 75 MG tablet    Sig: Take 1 tablet (75 mg total) by mouth daily with breakfast.    Dispense:  90 tablet    Refill:  2   losartan (COZAAR) 25 MG tablet    Sig: Take 1 tablet (25 mg total) by mouth daily.    Dispense:  90 tablet    Refill:  2   ezetimibe (ZETIA) 10 MG tablet    Sig: Take 1 tablet (10 mg total) by mouth daily.    Dispense:  90 tablet    Refill:  2     Patient Instructions  Medication Instructions:   START TAKING ZETIA 10 MG BY MOUTH DAILY  *If you need a refill on your  cardiac medications before your next appointment, please call your pharmacy*   Lab Work:  IN 6-8 WEEKS HERE IN THE OFFICE--WILL CHECK LIPIDS AT THAT TIME--PLEASE  COME FASTING TO THIS LAB APPOINTMENT  If you have labs (blood work) drawn today and your tests are completely normal, you will receive your results only by: [place] (if you have MyChart) OR A paper copy in the mail If you have any lab test that is abnormal or we need to change your treatment, we will call you to review the results.   Follow-Up: At [facility], you and your health needs are our priority.  As part of our continuing mission to provide you with exceptional heart care, we have created designated Provider Care Teams.  These Care Teams include your primary Cardiologist (physician) and Advanced Practice Providers (APPs -  Physician Assistants and Nurse Practitioners) who all work together to provide you with the care you need, when you need it.  We recommend signing up for the patient portal called "MyChart".  Sign up information is provided on this After Visit Summary.  MyChart is used to connect with patients for Virtual Visits (Telemedicine).  Patients are able to view lab/test results, encounter notes, upcoming appointments, etc.  Non-urgent messages can be sent to your provider as well.   To learn more about what you can do with MyChart, go to [email or web address].    Your next appointment:   6 month(s)  The format for your next appointment:   In Person  Provider:   DR. Johney Frame    Signed, Freada Bergeron, MD  01/06/2021 10:17 AM    [place]

## 2021-01-05 ENCOUNTER — Other Ambulatory Visit: Payer: Self-pay

## 2021-01-05 MED ORDER — ROSUVASTATIN CALCIUM 20 MG PO TABS: 20.0000 mg | ORAL_TABLET | Freq: Every day | ORAL | 5 refills | Status: DC

## 2021-01-06 ENCOUNTER — Other Ambulatory Visit: Payer: Self-pay

## 2021-01-06 ENCOUNTER — Ambulatory Visit (INDEPENDENT_AMBULATORY_CARE_PROVIDER_SITE_OTHER): Payer: 59 | Admitting: Cardiology

## 2021-01-06 ENCOUNTER — Encounter: Payer: Self-pay | Admitting: Cardiology

## 2021-01-06 VITALS — BP 128/80 | HR 58 | Ht 67.0 in | Wt 220.0 lb

## 2021-01-06 DIAGNOSIS — I2510 Atherosclerotic heart disease of native coronary artery without angina pectoris: Secondary | ICD-10-CM

## 2021-01-06 DIAGNOSIS — Z720 Tobacco use: Secondary | ICD-10-CM

## 2021-01-06 DIAGNOSIS — Z79899 Other long term (current) drug therapy: Secondary | ICD-10-CM | POA: Diagnosis not present

## 2021-01-06 DIAGNOSIS — E785 Hyperlipidemia, unspecified: Secondary | ICD-10-CM | POA: Diagnosis not present

## 2021-01-06 DIAGNOSIS — I10 Essential (primary) hypertension: Secondary | ICD-10-CM | POA: Diagnosis not present

## 2021-01-06 DIAGNOSIS — I214 Non-ST elevation (NSTEMI) myocardial infarction: Secondary | ICD-10-CM

## 2021-01-06 MED ORDER — EZETIMIBE 10 MG PO TABS: 10.0000 mg | ORAL_TABLET | Freq: Every day | ORAL | 2 refills | Status: DC

## 2021-01-06 MED ORDER — LOSARTAN POTASSIUM 25 MG PO TABS: 25.0000 mg | ORAL_TABLET | Freq: Every day | ORAL | 2 refills | Status: DC

## 2021-01-06 MED ORDER — CLOPIDOGREL BISULFATE 75 MG PO TABS: 75.0000 mg | ORAL_TABLET | Freq: Every day | ORAL | 2 refills | Status: DC

## 2021-01-06 NOTE — Patient Instructions (Signed)
Medication Instructions:   START TAKING ZETIA 10 MG BY MOUTH DAILY  *If you need a refill on your cardiac medications before your next appointment, please call your pharmacy*   Lab Work:  IN 6-8 WEEKS HERE IN THE OFFICE--WILL CHECK LIPIDS AT THAT TIME--PLEASE COME FASTING TO THIS LAB APPOINTMENT  If you have labs (blood work) drawn today and your tests are completely normal, you will receive your results only by: MyChart Message (if you have MyChart) OR A paper copy in the mail If you have any lab test that is abnormal or we need to change your treatment, we will call you to review the results.   Follow-Up: At [facility], you and your health needs are our priority.  As part of our continuing mission to provide you with exceptional heart care, we have created designated Provider Care Teams.  These Care Teams include your primary Cardiologist (physician) and Advanced Practice Providers (APPs -  Physician Assistants and Nurse Practitioners) who all work together to provide you with the care you need, when you need it.  We recommend signing up for the patient portal called "MyChart".  Sign up information is provided on this After Visit Summary.  MyChart is used to connect with patients for Virtual Visits (Telemedicine).  Patients are able to view lab/test results, encounter notes, upcoming appointments, etc.  Non-urgent messages can be sent to your provider as well.   To learn more about what you can do with MyChart, go to [email or web address].    Your next appointment:   6 month(s)  The format for your next appointment:   In Person  Provider:   DR. Shari Prows

## 2021-01-08 ENCOUNTER — Other Ambulatory Visit: Payer: Self-pay

## 2021-01-08 MED ORDER — METOPROLOL SUCCINATE ER 25 MG PO TB24: 12.5000 mg | ORAL_TABLET | Freq: Every day | ORAL | 3 refills | Status: DC

## 2021-02-17 ENCOUNTER — Other Ambulatory Visit: Payer: 59 | Admitting: *Deleted

## 2021-02-17 ENCOUNTER — Other Ambulatory Visit: Payer: Self-pay

## 2021-02-17 DIAGNOSIS — I2510 Atherosclerotic heart disease of native coronary artery without angina pectoris: Secondary | ICD-10-CM

## 2021-02-17 DIAGNOSIS — E785 Hyperlipidemia, unspecified: Secondary | ICD-10-CM

## 2021-02-17 DIAGNOSIS — Z79899 Other long term (current) drug therapy: Secondary | ICD-10-CM

## 2021-02-17 LAB — LIPID PANEL
Chol/HDL Ratio: 1.9 ratio (ref 0.0–5.0)
Cholesterol, Total: 79 mg/dL — ABNORMAL LOW (ref 100–199)
HDL: 41 mg/dL (ref >39)
LDL Chol Calc (NIH): 25 mg/dL (ref 0–99)
Triglycerides: 48 mg/dL (ref 0–149)
VLDL Cholesterol Cal: 13 mg/dL (ref 5–40)

## 2021-05-13 ENCOUNTER — Telehealth: Payer: Self-pay | Admitting: Cardiology

## 2021-05-13 NOTE — Telephone Encounter (Signed)
Pt c/o medication issue: ? ?1. Name of Medication: clopidogrel (PLAVIX) 75 MG tablet ? ?2. How are you currently taking this medication (dosage and times per day)? As directed  ? ?3. Are you having a reaction (difficulty breathing--STAT)? nosebleeds ? ?4. What is your medication issue? Patient said he gets nosebleeds on and off with this medication when he gets overheated. He said he had one yesterday that only lasted about 20 minutes. It is not bleeding now. He is just not sure what to do  ?

## 2021-05-13 NOTE — Telephone Encounter (Signed)
I spoke with patient.  He reports 2 nosebleeds yesterday.  First was in the morning and lasted about 5 minutes.  Second was last evening and lasted 20 minutes.  Only right nostril. He had to go to ED last October for nasal bleeding.  Last night he used nasal spray he was given in ED and this stopped the bleeding.  Today he is not having any bleeding.  Patient states he wears CPAP at night.  No allergy symptoms.  I explained the importance of continuing ASA and Plavix.  Patient reports he has not missed any doses. I asked him to let us know if bleeding occurs again.  He is aware to go to Urgent Care if bleeding occurs again and does not stop.  ?

## 2021-06-29 ENCOUNTER — Other Ambulatory Visit: Payer: Self-pay | Admitting: Cardiology

## 2021-06-29 DIAGNOSIS — G4733 Obstructive sleep apnea (adult) (pediatric): Secondary | ICD-10-CM

## 2021-07-05 NOTE — Progress Notes (Unsigned)
Cardiology Office Note:    Date:  07/08/2021   ID:  Jesse Wells, DOB 05-11-1965, MRN [id number]  PCP:  [facility], [place] [facility] Cardiologist:  Fransico Him, MD {  Referring MD: [facility]*    History of Present Illness:    Jesse Wells is a 56 y.o. male with a hx of CAD s/p PCI to LAD on 05/29/20, HTN and HLD who presents to clinic for follow-up of his CAD.  Patient presented to [facility] hospital on 05/27/20 with worsening chest pain concerning for unstable angina found to have severe prox LAD stenosis s/p PCI on 05/28/20. TTE 05/28/20 with LVEF 50-55%, normal diastology, trivial MR, no other significant valve disease. He did well and was discharged on 05/28/20.  Was last seen in clinic in 12/2020 where he was doing well from a CV standpoint. Was working on quitting smoking. No significant chest pain or SOB.  Today, the patient overall feels well. No chest pain, SOB, LE edema or orthopnea. Has been compliant with all medications. He admits that he started smoking more due to stressful times in his life. He is working on cutting back again. Otherwise he is active. Continues to try to eat healthy with no fast food or fried foods.    Past Medical History:  Diagnosis Date   CAD (coronary artery disease)    Dyspnea on exertion    Hypertension    SOB (shortness of breath)     Past Surgical History:  Procedure Laterality Date   CORONARY STENT INTERVENTION N/A 05/28/2020   Procedure: CORONARY STENT INTERVENTION;  Surgeon: Sherren Mocha, MD;  Location: [place] [facility];  Service: Cardiovascular;  Laterality: N/A;   LEFT HEART CATH AND CORONARY ANGIOGRAPHY N/A 05/28/2020   Procedure: LEFT HEART CATH AND CORONARY ANGIOGRAPHY;  Surgeon: Sherren Mocha, MD;  Location: [place] [facility];  Service: Cardiovascular;  Laterality: N/A;   NO PAST SURGERIES      Current Medications: Current Meds  Medication Sig   aspirin 81 MG chewable tablet  Chew 1 tablet (81 mg total) by mouth daily.   metoprolol succinate (TOPROL XL) 25 MG 24 hr tablet Take 1 tablet (25 mg total) by mouth daily.   Multiple Vitamins-Minerals (ONE-A-DAY MENS 50+ PO) Take 1 tablet by mouth daily.   [DISCONTINUED] clopidogrel (PLAVIX) 75 MG tablet Take 1 tablet (75 mg total) by mouth daily with breakfast.   [DISCONTINUED] ezetimibe (ZETIA) 10 MG tablet Take 1 tablet (10 mg total) by mouth daily.   [DISCONTINUED] losartan (COZAAR) 25 MG tablet Take 1 tablet (25 mg total) by mouth daily.   [DISCONTINUED] metoprolol succinate (TOPROL-XL) 25 MG 24 hr tablet Take 1/2 tablet (12.5 mg total) by mouth daily.   [DISCONTINUED] rosuvastatin (CRESTOR) 20 MG tablet Take 1 tablet (20 mg total) by mouth daily.     Allergies:   Patient has no known allergies.   Social History   Socioeconomic History   Marital status: Married    Spouse name: Not on file   Number of children: Not on file   Years of education: Not on file   Highest education level: Not on file  Occupational History   Not on file  Tobacco Use   Smoking status: Former    Packs/day: 0.50    Types: Cigarettes   Smokeless tobacco: Never  Vaping Use   Vaping Use: Not on file  Substance and Sexual Activity   Alcohol use: Never   Drug use:  Never   Sexual activity: Not on file  Other Topics Concern   Not on file  Social History Narrative   Not on file   Social Determinants of Health   Financial Resource Strain: Not on file  Food Insecurity: Not on file  Transportation Needs: Not on file  Physical Activity: Not on file  Stress: Not on file  Social Connections: Not on file     Family History: The patient's family history includes Diabetes in his father; Hypertension in his mother. There is no history of Heart disease.  ROS:   Review of Systems  Constitutional:  Negative for chills, diaphoresis, fever and malaise/fatigue.  HENT:  Negative for sore throat.   Respiratory:  Negative for cough and  shortness of breath.   Cardiovascular:  Negative for chest pain, palpitations, orthopnea, claudication, leg swelling and PND.  Gastrointestinal:  Negative for blood in stool.  Genitourinary:  Negative for hematuria.  Skin:  Negative for rash.  Neurological:  Negative for dizziness and loss of consciousness.     EKGs/Labs/Other Studies Reviewed:    The following studies were reviewed today: Cath 05/28/20: 1. Left dominant coronary circulation 2. Severe proximal LAD stenosis, treated successfully with PCI using a 4.0x20 mm Synergy DES 3. Patent dominant LCx and patent nondominant RCA without significant stenoses Diagnostic Dominance: Left   Left Anterior Descending  There is severe proximal LAD stenosis. Otherwise the vessel is patent with no stenosis. The LAD wraps around the LV apex and supplies the distal inferior wall.  Prox LAD lesion is 85% stenosed. The lesion is eccentric.  Left Circumflex  The vessel exhibits minimal luminal irregularities.  Right Coronary Artery  The vessel exhibits minimal luminal irregularities.    Intervention     Prox LAD lesion  Stent  CATH LAUNCHER 6FR EBU 3 guide catheter was inserted. Lesion crossed with guidewire using a WIRE COUGAR XT STRL 190CM. Pre-stent angioplasty was not performed. A drug-eluting stent was successfully placed using a SYNERGY XD 4.0X20. Maximum pressure: 14 atm. Post-stent angioplasty was performed using a BALLN [place] SAPPHIRE 4.5X15. Maximum pressure: 14 atm.  Post-Intervention Lesion Assessment  The intervention was successful. Pre-interventional TIMI flow is 3. Post-intervention TIMI flow is 3. No complications occurred at this lesion.  There is a 0% residual stenosis post intervention.      Coronary Diagrams     Diagnostic Dominance: Left      Intervention            TTE 05/28/20: IMPRESSIONS   1. Inferior basal hypokinesis . Left ventricular ejection fraction, by  estimation, is 50 to 55%. The left ventricle  has low normal function. The  left ventricle has no regional wall motion abnormalities. Left ventricular  diastolic parameters were normal.   2. Right ventricular systolic function is normal. The right ventricular  size is normal.   3. The mitral valve is normal in structure. Trivial mitral valve  regurgitation. No evidence of mitral stenosis.   4. The aortic valve is tricuspid. There is mild calcification of the  aortic valve. Aortic valve regurgitation is not visualized. Mild aortic  valve sclerosis is present, with no evidence of aortic valve stenosis.   5. The inferior vena cava is normal in size with greater than 50%  respiratory variability, suggesting right atrial pressure of 3 mmHg.    EKG:  No new ECG  Recent Labs: No results found for requested labs within last 365 days.  Recent Lipid Panel    Component Value  Date/Time   CHOL 79 (L) 02/17/2021 0813   TRIG 48 02/17/2021 0813   HDL 41 02/17/2021 0813   CHOLHDL 1.9 02/17/2021 0813   CHOLHDL 3.8 05/28/2020 0356   VLDL 13 05/28/2020 0356   LDLCALC 25 02/17/2021 0813     Physical Exam:    VS:  BP 138/78   Pulse 75   Ht '5\' 7"'  (1.702 m)   Wt 222 lb (100.7 kg)   SpO2 98%   BMI 34.77 kg/m     Wt Readings from Last 3 Encounters:  07/08/21 222 lb (100.7 kg)  01/06/21 220 lb (99.8 kg)  11/17/20 215 lb (97.5 kg)     GEN: Well nourished, well developed in no acute distress HEENT: Normal NECK: No JVD; No carotid bruits CARDIAC:RRR, no murmurs RESPIRATORY:  CTAB, no wheezes ABDOMEN: Soft, non-tender, non-distended MUSCULOSKELETAL:  No edema; No deformity  SKIN: Warm and dry NEUROLOGIC:  Alert and oriented x 3 PSYCHIATRIC:  Normal affect   ASSESSMENT:    1. Coronary artery disease involving native coronary artery of native heart without angina pectoris   2. Medication management   3. Hyperlipidemia, unspecified hyperlipidemia type   4. OSA (obstructive sleep apnea)   5. Primary hypertension   6. Non-ST  elevation (NSTEMI) myocardial infarction [facility])    PLAN:    In order of problems listed above:  #Coronary Artery Disease s/p PCI to LAD: #History of NSTEMI: Patient presented with progressive exertional chest pain on 05/27/20 found to have prox LAD stenosis on cath on 05/28/20 now s/p successful PCI on 05/28/20. Doing well without anginal symptoms. Compliant with all home medications.  -Continue ASA 67m daily -Stop plavix as has completed 1 year of DAPT -Increase metop to 276mXL daily -Continue losartan 2561maily -Down to 2 cigarettes per day; encouraged cessation   #HTN: Mildly elevated in 130s at home.  -Continue losartan 49m52mily -Increase metop to 49mg61mdaily   #HLD: LDL very well controlled at 25 in 01/2021 -Continue crestor 20mg 31my -Continue zetia 10mg d36m -Goal <55   #Tobacco Abuse: -Motivated to quit; discussed cessation at length today  #OSA: -Compliant with CPAP   Medication Adjustments/Labs and Tests Ordered: Current medicines are reviewed at length with the patient today.  Concerns regarding medicines are outlined above.  No orders of the defined types were placed in this encounter.  Meds ordered this encounter  Medications   metoprolol succinate (TOPROL XL) 25 MG 24 hr tablet    Sig: Take 1 tablet (25 mg total) by mouth daily.    Dispense:  90 tablet    Refill:  2    DOSE INCREASE   rosuvastatin (CRESTOR) 20 MG tablet    Sig: Take 1 tablet (20 mg total) by mouth daily.    Dispense:  90 tablet    Refill:  3   losartan (COZAAR) 25 MG tablet    Sig: Take 1 tablet (25 mg total) by mouth daily.    Dispense:  90 tablet    Refill:  2   ezetimibe (ZETIA) 10 MG tablet    Sig: Take 1 tablet (10 mg total) by mouth daily.    Dispense:  90 tablet    Refill:  2     Patient Instructions  Medication Instructions:   STOP TAKING PLAVIX NOW  INCREASE YOUR METOPROLOL SUCCINATE (TOPROL XL) TO 25 MG BY MOUTH DAILY  *If you need a refill on your  cardiac medications before your next appointment, please call your  pharmacy*   Follow-Up: At [facility], you and your health needs are our priority.  As part of our continuing mission to provide you with exceptional heart care, we have created designated Provider Care Teams.  These Care Teams include your primary Cardiologist (physician) and Advanced Practice Providers (APPs -  Physician Assistants and Nurse Practitioners) who all work together to provide you with the care you need, when you need it.  We recommend signing up for the patient portal called "MyChart".  Sign up information is provided on this After Visit Summary.  MyChart is used to connect with patients for Virtual Visits (Telemedicine).  Patients are able to view lab/test results, encounter notes, upcoming appointments, etc.  Non-urgent messages can be sent to your provider as well.   To learn more about what you can do with MyChart, go to [email or web address].    Your next appointment:   6 month(s)  The format for your next appointment:   In Person  Provider:   DR. Johney Frame   Important Information About Sugar          Signed, Freada Bergeron, MD  07/08/2021 8:57 AM    [place]

## 2021-07-07 ENCOUNTER — Ambulatory Visit: Payer: 59 | Admitting: Cardiology

## 2021-07-08 ENCOUNTER — Ambulatory Visit (INDEPENDENT_AMBULATORY_CARE_PROVIDER_SITE_OTHER): Payer: 59 | Admitting: Cardiology

## 2021-07-08 ENCOUNTER — Encounter: Payer: Self-pay | Admitting: Cardiology

## 2021-07-08 VITALS — BP 138/78 | HR 75 | Ht 67.0 in | Wt 222.0 lb

## 2021-07-08 DIAGNOSIS — Z79899 Other long term (current) drug therapy: Secondary | ICD-10-CM

## 2021-07-08 DIAGNOSIS — I214 Non-ST elevation (NSTEMI) myocardial infarction: Secondary | ICD-10-CM

## 2021-07-08 DIAGNOSIS — I2510 Atherosclerotic heart disease of native coronary artery without angina pectoris: Secondary | ICD-10-CM | POA: Diagnosis not present

## 2021-07-08 DIAGNOSIS — I10 Essential (primary) hypertension: Secondary | ICD-10-CM

## 2021-07-08 DIAGNOSIS — G4733 Obstructive sleep apnea (adult) (pediatric): Secondary | ICD-10-CM

## 2021-07-08 DIAGNOSIS — E785 Hyperlipidemia, unspecified: Secondary | ICD-10-CM | POA: Diagnosis not present

## 2021-07-08 MED ORDER — EZETIMIBE 10 MG PO TABS: 10.0000 mg | ORAL_TABLET | Freq: Every day | ORAL | 2 refills | Status: DC

## 2021-07-08 MED ORDER — METOPROLOL SUCCINATE ER 25 MG PO TB24: 25.0000 mg | ORAL_TABLET | Freq: Every day | ORAL | 2 refills | Status: DC

## 2021-07-08 MED ORDER — ROSUVASTATIN CALCIUM 20 MG PO TABS: 20.0000 mg | ORAL_TABLET | Freq: Every day | ORAL | 3 refills | Status: DC

## 2021-07-08 MED ORDER — LOSARTAN POTASSIUM 25 MG PO TABS: 25.0000 mg | ORAL_TABLET | Freq: Every day | ORAL | 2 refills | Status: DC

## 2021-07-08 NOTE — Patient Instructions (Signed)
Medication Instructions:   STOP TAKING PLAVIX NOW  INCREASE YOUR METOPROLOL SUCCINATE (TOPROL XL) TO 25 MG BY MOUTH DAILY  *If you need a refill on your cardiac medications before your next appointment, please call your pharmacy*   Follow-Up: At [facility], you and your health needs are our priority.  As part of our continuing mission to provide you with exceptional heart care, we have created designated Provider Care Teams.  These Care Teams include your primary Cardiologist (physician) and Advanced Practice Providers (APPs -  Physician Assistants and Nurse Practitioners) who all work together to provide you with the care you need, when you need it.  We recommend signing up for the patient portal called "MyChart".  Sign up information is provided on this After Visit Summary.  MyChart is used to connect with patients for Virtual Visits (Telemedicine).  Patients are able to view lab/test results, encounter notes, upcoming appointments, etc.  Non-urgent messages can be sent to your provider as well.   To learn more about what you can do with MyChart, go to [email or web address].    Your next appointment:   6 month(s)  The format for your next appointment:   In Person  Provider:   DR. Shari Prows   Important Information About Sugar

## 2021-12-09 IMAGING — CR DG CHEST 2V
2 series · 2 of 2 positions shown · non-contrast
Comparison: None.

CLINICAL DATA: Mid chest pain for 2 weeks. Shortness of breath
tonight.

EXAM:
CHEST - 2 VIEW

[w chest pa]
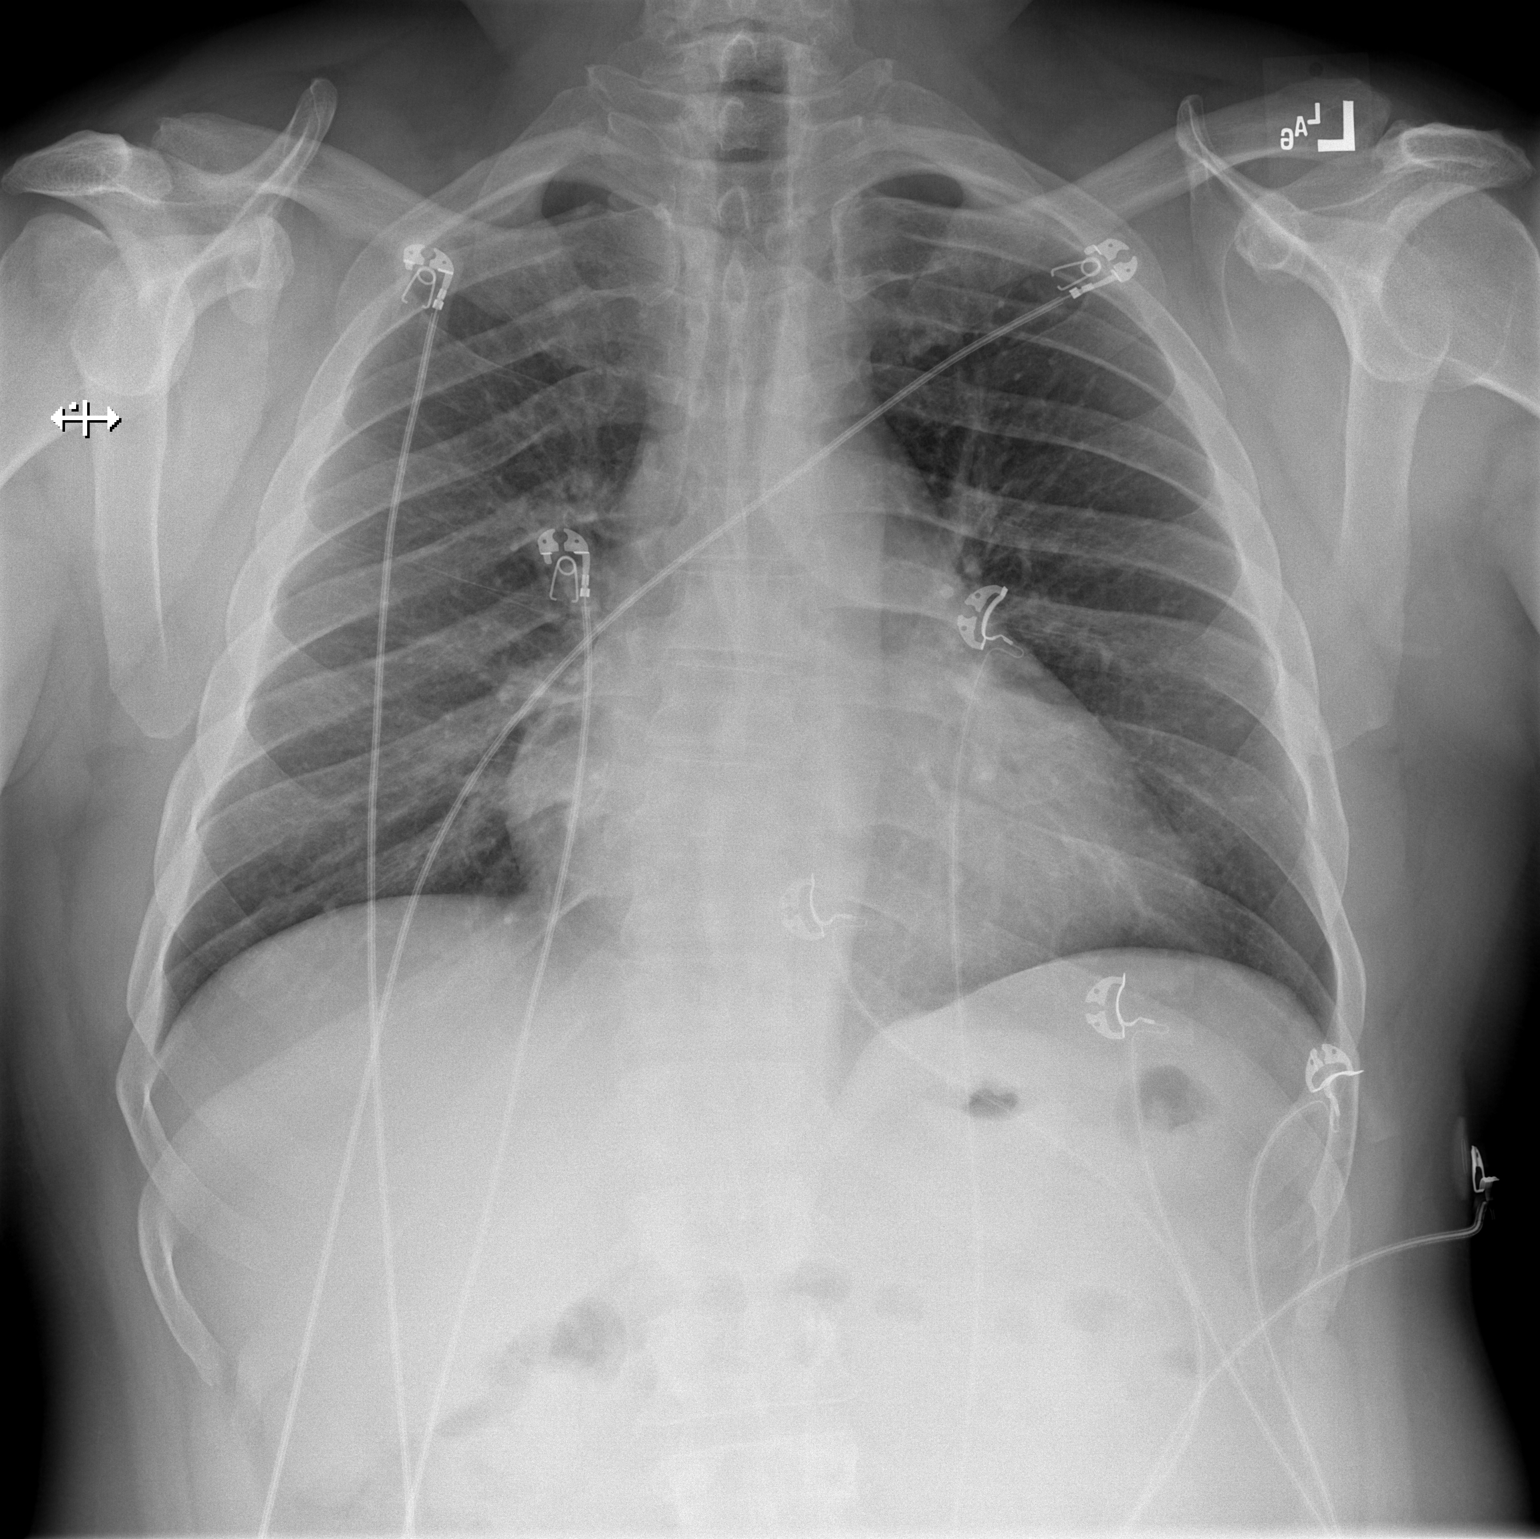

[w chest lat]
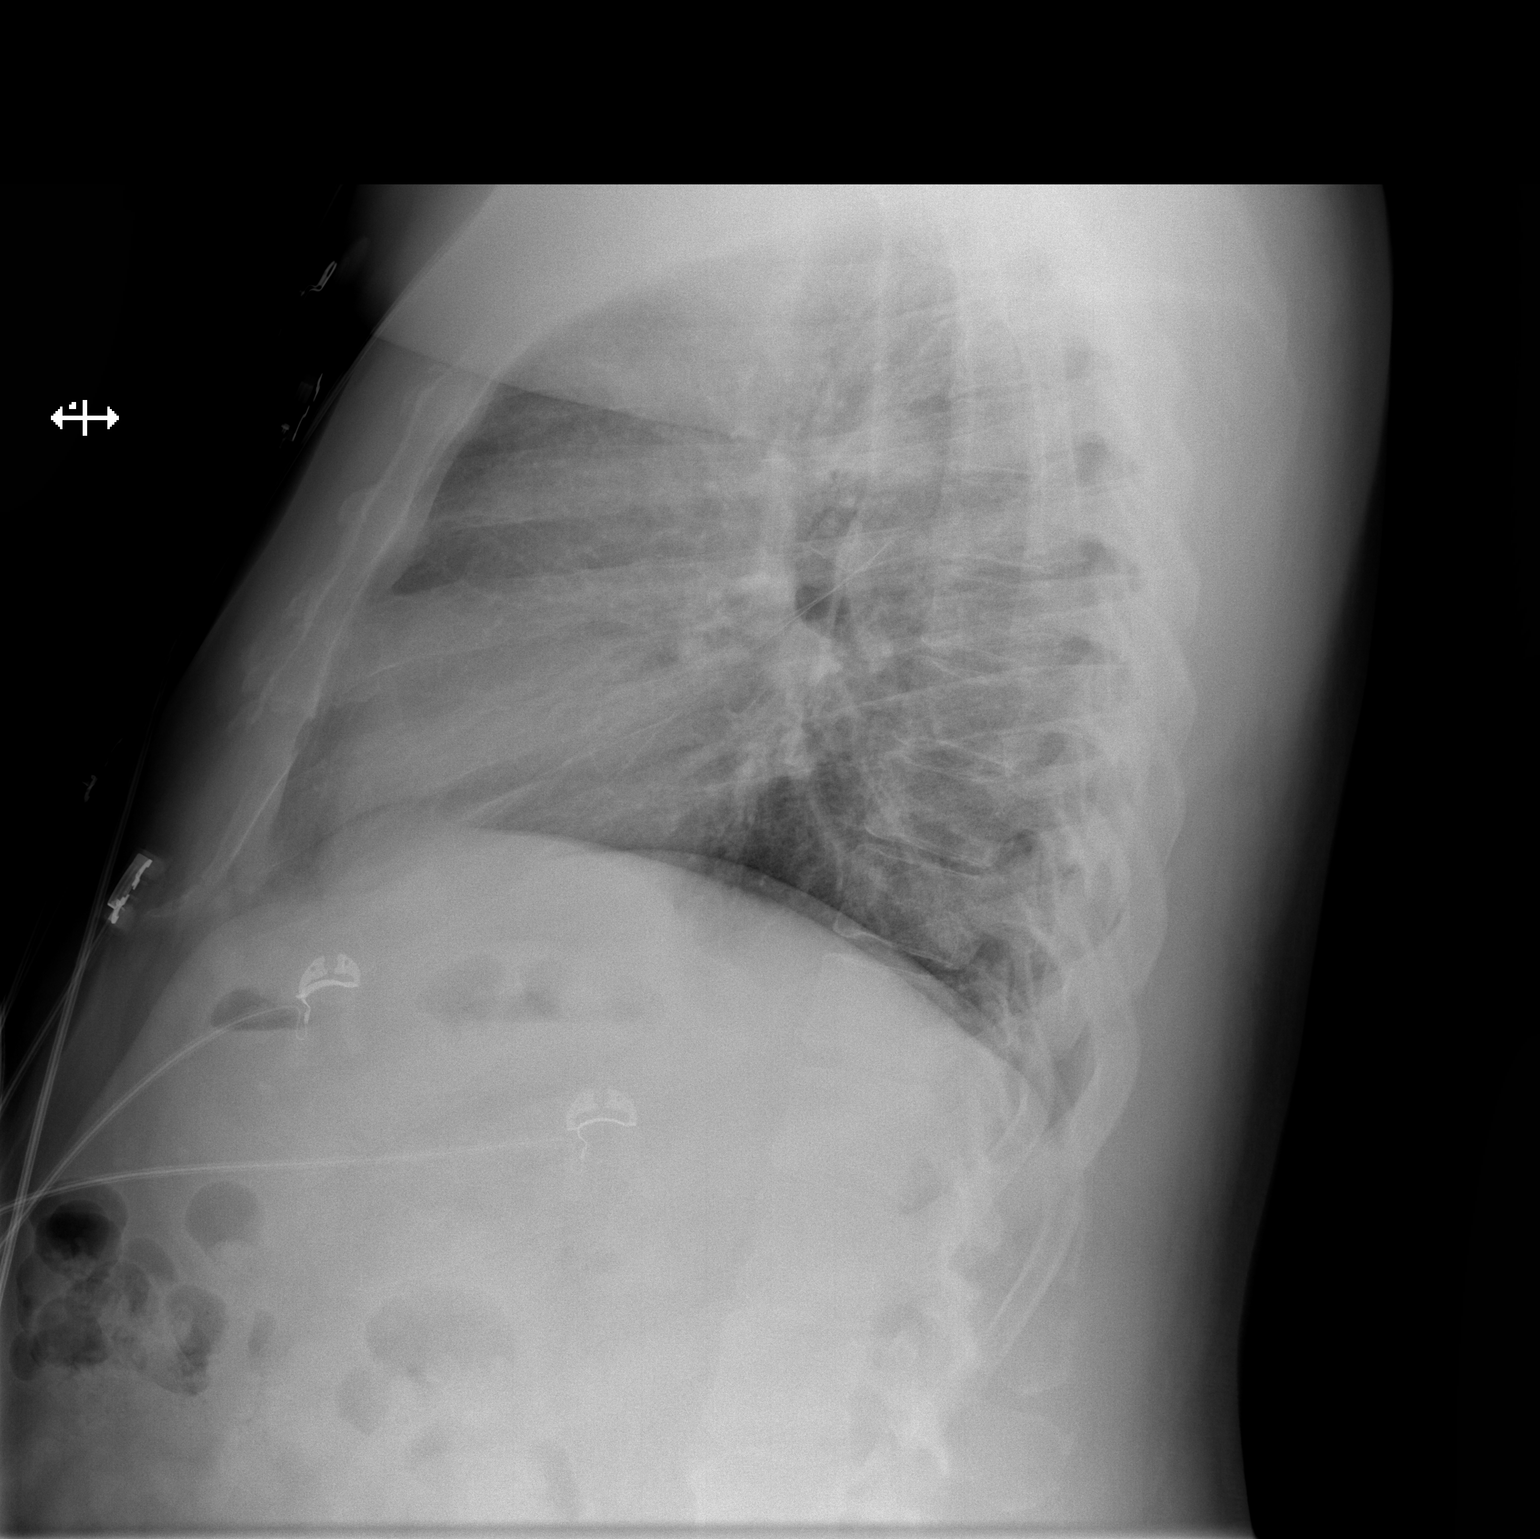

[2 of 2 positions shown; findings below may reference images not displayed]

FINDINGS: The cardiomediastinal contours are normal. Mild bronchial
thickening. Pulmonary vasculature is normal. No consolidation,
pleural effusion, or pneumothorax. No acute osseous abnormalities
are seen.
IMPRESSION: Mild bronchial thickening which can be seen with asthma or
bronchitis.

## 2021-12-25 NOTE — Progress Notes (Unsigned)
Cardiology Office Note:    Date:  12/25/2021   ID:  Jesse Wells, DOB April 21, 1965, MRN [id number]  PCP:  [facility], [facility] Cardiologist:  Fransico Him, MD {  Referring MD: [facility]*    History of Present Illness:    Jesse Wells is a 56 y.o. male with a hx of CAD s/p PCI to LAD on 05/29/20, HTN and HLD who presents to clinic for follow-up of his CAD.  Patient presented to [facility] hospital on 05/27/20 with worsening chest pain concerning for unstable angina found to have severe prox LAD stenosis s/p PCI on 05/28/20. TTE 05/28/20 with LVEF 50-55%, normal diastology, trivial MR, no other significant valve disease. He did well and was discharged on 05/28/20.  Was last seen in clinic in 06/2021 where he was doing very well. Had started smoking again but was working on cutting back.  Today, ***    Past Medical History:  Diagnosis Date   CAD (coronary artery disease)    Dyspnea on exertion    Hypertension    SOB (shortness of breath)     Past Surgical History:  Procedure Laterality Date   CORONARY STENT INTERVENTION N/A 05/28/2020   Procedure: CORONARY STENT INTERVENTION;  Surgeon: Sherren Mocha, MD;  Location: [place] [facility];  Service: Cardiovascular;  Laterality: N/A;   LEFT HEART CATH AND CORONARY ANGIOGRAPHY N/A 05/28/2020   Procedure: LEFT HEART CATH AND CORONARY ANGIOGRAPHY;  Surgeon: Sherren Mocha, MD;  Location: [place] [facility];  Service: Cardiovascular;  Laterality: N/A;   NO PAST SURGERIES      Current Medications: No outpatient medications have been marked as taking for the 12/28/21 encounter (Appointment) with Freada Bergeron, MD.     Allergies:   Patient has no known allergies.   Social History   Socioeconomic History   Marital status: Married    Spouse name: Not on file   Number of children: Not on file   Years of education: Not on file   Highest education level: Not on file  Occupational  History   Not on file  Tobacco Use   Smoking status: Former    Packs/day: 0.50    Types: Cigarettes   Smokeless tobacco: Never  Vaping Use   Vaping Use: Not on file  Substance and Sexual Activity   Alcohol use: Never   Drug use: Never   Sexual activity: Not on file  Other Topics Concern   Not on file  Social History Narrative   Not on file   Social Determinants of Health   Financial Resource Strain: Not on file  Food Insecurity: Not on file  Transportation Needs: Not on file  Physical Activity: Not on file  Stress: Not on file  Social Connections: Not on file     Family History: The patient's family history includes Diabetes in his father; Hypertension in his mother. There is no history of Heart disease.  ROS:   Review of Systems  Constitutional:  Negative for chills, diaphoresis, fever and malaise/fatigue.  HENT:  Negative for sore throat.   Respiratory:  Negative for cough and shortness of breath.   Cardiovascular:  Negative for chest pain, palpitations, orthopnea, claudication, leg swelling and PND.  Gastrointestinal:  Negative for blood in stool.  Genitourinary:  Negative for hematuria.  Skin:  Negative for rash.  Neurological:  Negative for dizziness and loss of consciousness.     EKGs/Labs/Other Studies Reviewed:    The following  studies were reviewed today: Cath 05/28/20: 1. Left dominant coronary circulation 2. Severe proximal LAD stenosis, treated successfully with PCI using a 4.0x20 mm Synergy DES 3. Patent dominant LCx and patent nondominant RCA without significant stenoses Diagnostic Dominance: Left   Left Anterior Descending  There is severe proximal LAD stenosis. Otherwise the vessel is patent with no stenosis. The LAD wraps around the LV apex and supplies the distal inferior wall.  Prox LAD lesion is 85% stenosed. The lesion is eccentric.  Left Circumflex  The vessel exhibits minimal luminal irregularities.  Right Coronary Artery  The vessel  exhibits minimal luminal irregularities.    Intervention     Prox LAD lesion  Stent  CATH LAUNCHER 6FR EBU 3 guide catheter was inserted. Lesion crossed with guidewire using a WIRE COUGAR XT STRL 190CM. Pre-stent angioplasty was not performed. A drug-eluting stent was successfully placed using a SYNERGY XD 4.0X20. Maximum pressure: 14 atm. Post-stent angioplasty was performed using a BALLN [place] SAPPHIRE 4.5X15. Maximum pressure: 14 atm.  Post-Intervention Lesion Assessment  The intervention was successful. Pre-interventional TIMI flow is 3. Post-intervention TIMI flow is 3. No complications occurred at this lesion.  There is a 0% residual stenosis post intervention.      Coronary Diagrams     Diagnostic Dominance: Left      Intervention            TTE 05/28/20: IMPRESSIONS   1. Inferior basal hypokinesis . Left ventricular ejection fraction, by  estimation, is 50 to 55%. The left ventricle has low normal function. The  left ventricle has no regional wall motion abnormalities. Left ventricular  diastolic parameters were normal.   2. Right ventricular systolic function is normal. The right ventricular  size is normal.   3. The mitral valve is normal in structure. Trivial mitral valve  regurgitation. No evidence of mitral stenosis.   4. The aortic valve is tricuspid. There is mild calcification of the  aortic valve. Aortic valve regurgitation is not visualized. Mild aortic  valve sclerosis is present, with no evidence of aortic valve stenosis.   5. The inferior vena cava is normal in size with greater than 50%  respiratory variability, suggesting right atrial pressure of 3 mmHg.    EKG:  SB, inferior q waves, HR 57  Recent Labs: No results found for requested labs within last 365 days.  Recent Lipid Panel    Component Value Date/Time   CHOL 79 (L) 02/17/2021 0813   TRIG 48 02/17/2021 0813   HDL 41 02/17/2021 0813   CHOLHDL 1.9 02/17/2021 0813   CHOLHDL 3.8 05/28/2020  0356   VLDL 13 05/28/2020 0356   LDLCALC 25 02/17/2021 0813     Physical Exam:    VS:  There were no vitals taken for this visit.    Wt Readings from Last 3 Encounters:  07/08/21 222 lb (100.7 kg)  01/06/21 220 lb (99.8 kg)  11/17/20 215 lb (97.5 kg)     GEN: Well nourished, well developed in no acute distress HEENT: Normal NECK: No JVD; No carotid bruits CARDIAC:RRR, no murmurs RESPIRATORY:  CTAB, no wheezes ABDOMEN: Soft, non-tender, non-distended MUSCULOSKELETAL:  No edema; No deformity  SKIN: Warm and dry NEUROLOGIC:  Alert and oriented x 3 PSYCHIATRIC:  Normal affect   ASSESSMENT:    No diagnosis found.  PLAN:    In order of problems listed above:  #Coronary Artery Disease s/p PCI to LAD: #History of NSTEMI: Patient presented with progressive exertional chest pain on  05/27/20 found to have prox LAD stenosis on cath on 05/28/20 now s/p successful PCI on 05/28/20. Doing well without anginal symptoms. Compliant with all home medications.  -Continue ASA 76m daily -Continue metop 229mXL daily -Continue losartan 2560maily -Smoking cessation encouraged   #HTN: Mildly elevated in 130s at home.  -Continue losartan 63m56mily -Continue metop 63mg19mdaily   #HLD: LDL very well controlled at 25 in 01/2021 -Continue crestor 20mg 59my -Continue zetia 10mg d42m -Goal <55   #Tobacco Abuse: -Motivated to quit; discussed cessation at length today  #OSA: -Compliant with CPAP   Medication Adjustments/Labs and Tests Ordered: Current medicines are reviewed at length with the patient today.  Concerns regarding medicines are outlined above.  No orders of the defined types were placed in this encounter.  No orders of the defined types were placed in this encounter.    There are no Patient Instructions on file for this visit.     Signed, HeatherFreada Bergeron2/01/2021 9:59 AM    [facility]

## 2021-12-28 ENCOUNTER — Ambulatory Visit: Payer: 59 | Attending: Cardiology | Admitting: Cardiology

## 2021-12-28 ENCOUNTER — Encounter: Payer: Self-pay | Admitting: Cardiology

## 2021-12-28 VITALS — BP 120/70 | HR 57 | Ht 67.0 in | Wt 222.2 lb

## 2021-12-28 DIAGNOSIS — Z79899 Other long term (current) drug therapy: Secondary | ICD-10-CM

## 2021-12-28 DIAGNOSIS — G4733 Obstructive sleep apnea (adult) (pediatric): Secondary | ICD-10-CM

## 2021-12-28 DIAGNOSIS — I2510 Atherosclerotic heart disease of native coronary artery without angina pectoris: Secondary | ICD-10-CM

## 2021-12-28 DIAGNOSIS — I10 Essential (primary) hypertension: Secondary | ICD-10-CM

## 2021-12-28 DIAGNOSIS — I214 Non-ST elevation (NSTEMI) myocardial infarction: Secondary | ICD-10-CM

## 2021-12-28 DIAGNOSIS — E785 Hyperlipidemia, unspecified: Secondary | ICD-10-CM

## 2021-12-28 DIAGNOSIS — Z720 Tobacco use: Secondary | ICD-10-CM

## 2021-12-28 MED ORDER — LOSARTAN POTASSIUM 25 MG PO TABS: 25.0000 mg | ORAL_TABLET | Freq: Every day | ORAL | 3 refills | Status: DC

## 2021-12-28 MED ORDER — METOPROLOL SUCCINATE ER 25 MG PO TB24: 25.0000 mg | ORAL_TABLET | Freq: Every day | ORAL | 3 refills | Status: DC

## 2021-12-28 MED ORDER — EZETIMIBE 10 MG PO TABS: 10.0000 mg | ORAL_TABLET | Freq: Every day | ORAL | 3 refills | Status: DC

## 2021-12-28 MED ORDER — ROSUVASTATIN CALCIUM 20 MG PO TABS: 20.0000 mg | ORAL_TABLET | Freq: Every day | ORAL | 3 refills | Status: DC

## 2021-12-28 NOTE — Patient Instructions (Signed)
Medication Instructions:    Your physician recommends that you continue on your current medications as directed. Please refer to the Current Medication list given to you today.  *If you need a refill on your cardiac medications before your next appointment, please call your pharmacy*   Lab Work:  TODAY--LIPIDS  If you have labs (blood work) drawn today and your tests are completely normal, you will receive your results only by: MyChart Message (if you have MyChart) OR A paper copy in the mail If you have any lab test that is abnormal or we need to change your treatment, we will call you to review the results.    Follow-Up: At [facility], you and your health needs are our priority.  As part of our continuing mission to provide you with exceptional heart care, we have created designated Provider Care Teams.  These Care Teams include your primary Cardiologist (physician) and Advanced Practice Providers (APPs -  Physician Assistants and Nurse Practitioners) who all work together to provide you with the care you need, when you need it.  We recommend signing up for the patient portal called "MyChart".  Sign up information is provided on this After Visit Summary.  MyChart is used to connect with patients for Virtual Visits (Telemedicine).  Patients are able to view lab/test results, encounter notes, upcoming appointments, etc.  Non-urgent messages can be sent to your provider as well.   To learn more about what you can do with MyChart, go to [email or web address].    Your next appointment:   6 month(s)  The format for your next appointment:   In Person  Provider:   DR. Shari Prows   Important Information About Sugar

## 2021-12-28 NOTE — Progress Notes (Signed)
Cardiology Office Note:    Date:  12/28/2021   ID:  [name], DOB Apr 28, 1965, MRN [id number]  PCP:  [facility] Cardiologist:  Fransico Him, MD {  Referring MD: [facility]*    History of Present Illness:    Jakim Drapeau is a 56 y.o. male with a hx of CAD s/p PCI to LAD on 05/29/20, HTN and HLD who presents to clinic for follow-up of his CAD.  Patient presented to [facility] hospital on 05/27/20 with worsening chest pain concerning for unstable angina found to have severe prox LAD stenosis s/p PCI on 05/28/20. TTE 05/28/20 with LVEF 50-55%, normal diastology, trivial MR, no other significant valve disease. He did well and was discharged on 05/28/20.  Was last seen in clinic in 06/2021 where he was doing very well. Had started smoking again but was working on cutting back.  Today, he is accompanied by a family member. The patient states that he is doing well overall.  He is tolerating his medicines, including his baby aspirin and metoprolol.  He states that he restarted smoking again. He smokes half a pack a day. He states that his job has been stressful. He is working on cutting back.  He denies any palpitations, chest pain, shortness of breath, or peripheral edema. No lightheadedness, headaches, syncope, orthopnea, or PND.   Past Medical History:  Diagnosis Date   CAD (coronary artery disease)    Dyspnea on exertion    Hypertension    SOB (shortness of breath)     Past Surgical History:  Procedure Laterality Date   CORONARY STENT INTERVENTION N/A 05/28/2020   Procedure: CORONARY STENT INTERVENTION;  Surgeon: Sherren Mocha, MD;  Location: [place] [facility];  Service: Cardiovascular;  Laterality: N/A;   LEFT HEART CATH AND CORONARY ANGIOGRAPHY N/A 05/28/2020   Procedure: LEFT HEART CATH AND CORONARY ANGIOGRAPHY;  Surgeon: Sherren Mocha, MD;  Location: [place] [facility];  Service: Cardiovascular;  Laterality: N/A;    NO PAST SURGERIES      Current Medications: Current Meds  Medication Sig   aspirin 81 MG chewable tablet Chew 1 tablet (81 mg total) by mouth daily.   ezetimibe (ZETIA) 10 MG tablet Take 1 tablet (10 mg total) by mouth daily.   losartan (COZAAR) 25 MG tablet Take 1 tablet (25 mg total) by mouth daily.   metoprolol succinate (TOPROL XL) 25 MG 24 hr tablet Take 1 tablet (25 mg total) by mouth daily.   Multiple Vitamins-Minerals (ONE-A-DAY MENS 50+ PO) Take 1 tablet by mouth daily.   rosuvastatin (CRESTOR) 20 MG tablet Take 1 tablet (20 mg total) by mouth daily.     Allergies:   Patient has no known allergies.   Social History   Socioeconomic History   Marital status: Married    Spouse name: Not on file   Number of children: Not on file   Years of education: Not on file   Highest education level: Not on file  Occupational History   Not on file  Tobacco Use   Smoking status: Former    Packs/day: 0.50    Types: Cigarettes   Smokeless tobacco: Never  Vaping Use   Vaping Use: Not on file  Substance and Sexual Activity   Alcohol use: Never   Drug use: Never   Sexual activity: Not on file  Other Topics Concern   Not on file  Social History Narrative   Not on file  Social Determinants of Health   Financial Resource Strain: Not on file  Food Insecurity: Not on file  Transportation Needs: Not on file  Physical Activity: Not on file  Stress: Not on file  Social Connections: Not on file     Family History: The patient's family history includes Diabetes in his father; Hypertension in his mother. There is no history of Heart disease.  ROS:   Review of Systems  Constitutional:  Negative for chills, diaphoresis, fever and malaise/fatigue.  HENT:  Negative for nosebleeds, sore throat and tinnitus.   Eyes:  Negative for blurred vision and pain.  Respiratory:  Negative for cough, hemoptysis, shortness of breath and stridor.   Cardiovascular:  Negative for chest pain,  palpitations, orthopnea, claudication, leg swelling and PND.  Gastrointestinal:  Negative for blood in stool, diarrhea, nausea and vomiting.  Genitourinary:  Negative for dysuria and hematuria.  Musculoskeletal:  Negative for falls.  Skin:  Negative for rash.  Neurological:  Negative for dizziness, loss of consciousness and headaches.  Psychiatric/Behavioral:  Negative for depression, hallucinations and substance abuse. The patient does not have insomnia.      EKGs/Labs/Other Studies Reviewed:    The following studies were reviewed today:  Cath 05/28/20: 1. Left dominant coronary circulation 2. Severe proximal LAD stenosis, treated successfully with PCI using a 4.0x20 mm Synergy DES 3. Patent dominant LCx and patent nondominant RCA without significant stenoses   Diagnostic Dominance: Left   Left Anterior Descending  There is severe proximal LAD stenosis. Otherwise the vessel is patent with no stenosis. The LAD wraps around the LV apex and supplies the distal inferior wall.  Prox LAD lesion is 85% stenosed. The lesion is eccentric.  Left Circumflex  The vessel exhibits minimal luminal irregularities.  Right Coronary Artery  The vessel exhibits minimal luminal irregularities.    Intervention     Prox LAD lesion  Stent  CATH LAUNCHER 6FR EBU 3 guide catheter was inserted. Lesion crossed with guidewire using a WIRE COUGAR XT STRL 190CM. Pre-stent angioplasty was not performed. A drug-eluting stent was successfully placed using a SYNERGY XD 4.0X20. Maximum pressure: 14 atm. Post-stent angioplasty was performed using a BALLN [place] SAPPHIRE 4.5X15. Maximum pressure: 14 atm.  Post-Intervention Lesion Assessment  The intervention was successful. Pre-interventional TIMI flow is 3. Post-intervention TIMI flow is 3. No complications occurred at this lesion.  There is a 0% residual stenosis post intervention.      Coronary Diagrams     Diagnostic Dominance: Left      Intervention             TTE 05/28/20: IMPRESSIONS   1. Inferior basal hypokinesis . Left ventricular ejection fraction, by  estimation, is 50 to 55%. The left ventricle has low normal function. The  left ventricle has no regional wall motion abnormalities. Left ventricular  diastolic parameters were normal.   2. Right ventricular systolic function is normal. The right ventricular  size is normal.   3. The mitral valve is normal in structure. Trivial mitral valve  regurgitation. No evidence of mitral stenosis.   4. The aortic valve is tricuspid. There is mild calcification of the  aortic valve. Aortic valve regurgitation is not visualized. Mild aortic  valve sclerosis is present, with no evidence of aortic valve stenosis.   5. The inferior vena cava is normal in size with greater than 50%  respiratory variability, suggesting right atrial pressure of 3 mmHg.    EKG:  EKG is personally reviewed. 12/28/2021: NSR.  Rate 57 bpm. 01/06/2021: Sinus Bradycardia, 58 bpm  Recent Labs: No results found for requested labs within last 365 days.   Recent Lipid Panel    Component Value Date/Time   CHOL 79 (L) 02/17/2021 0813   TRIG 48 02/17/2021 0813   HDL 41 02/17/2021 0813   CHOLHDL 1.9 02/17/2021 0813   CHOLHDL 3.8 05/28/2020 0356   VLDL 13 05/28/2020 0356   LDLCALC 25 02/17/2021 0813     Physical Exam:    VS:  BP 120/70   Pulse (!) 57   Ht _0  (1.702 m)   Wt 222 lb 3.2 oz (100.8 kg)   SpO2 97%   BMI 34.80 kg/m     Wt Readings from Last 3 Encounters:  12/28/21 222 lb 3.2 oz (100.8 kg)  07/08/21 222 lb (100.7 kg)  01/06/21 220 lb (99.8 kg)     GEN: Well nourished, well developed in no acute distress HEENT: Normal NECK: No JVD; No carotid bruits CARDIAC: RRR, no murmurs RESPIRATORY:  CTAB, no wheezes ABDOMEN: Soft, non-tender, non-distended MUSCULOSKELETAL:  No edema; No deformity  SKIN: Warm and dry NEUROLOGIC:  Alert and oriented x 3 PSYCHIATRIC:  Normal affect   ASSESSMENT:     1. Coronary artery disease involving native coronary artery of native heart without angina pectoris   2. Hyperlipidemia, unspecified hyperlipidemia type   3. Primary hypertension   4. Non-ST elevation (NSTEMI) myocardial infarction ([place])   5. Tobacco abuse   6. OSA (obstructive sleep apnea)   7. Medication management    PLAN:    In order of problems listed above:  #Coronary Artery Disease s/p PCI to LAD: #History of NSTEMI: Patient presented with progressive exertional chest pain on 05/27/20 found to have prox LAD stenosis on cath on 05/28/20 now s/p successful PCI on 05/28/20. Doing well without anginal symptoms. Compliant with all home medications.  -Continue ASA 71m daily -Continue metop 250mXL daily -Continue losartan 2550maily -Smoking cessation encouraged   #HTN: Well controlled and at goal <130/90. -Continue losartan 59m11mily -Continue metop 59mg10mdaily   #HLD: LDL very well controlled at 25 in 01/2021 -Continue crestor 20mg 77my -Continue zetia 10mg d81m -Goal <55 -Repeat lipids today   #Tobacco Abuse: -Motivated to quit; discussed cessation at length today  #OSA: -Compliant with CPAP  Follow-up: 6 months  Medication Adjustments/Labs and Tests Ordered: Current medicines are reviewed at length with the patient today.  Concerns regarding medicines are outlined above.   No orders of the defined types were placed in this encounter.  No orders of the defined types were placed in this encounter.  There are no Patient Instructions on file for this visit.   I,Mitra Faeizi,acting as a scribe Education administratoratherFreada Bergeronave documented all relevant documentation on the behalf of HeatherFreada Bergeron directed by  HeatherFreada Bergeronile in the presence of HeatherFreada Bergeron I, HeatherFreada Bergeronave reviewed all documentation for this visit. The documentation on 12/28/21 for the exam, diagnosis, procedures, and orders are all  accurate and complete.   Signed, HeatherFreada Bergeron2/04/2021 8:18 AM    [facility]

## 2021-12-29 LAB — LIPID PANEL
Chol/HDL Ratio: 2.1 ratio (ref 0.0–5.0)
Cholesterol, Total: 114 mg/dL (ref 100–199)
HDL: 54 mg/dL (ref >39)
LDL Chol Calc (NIH): 48 mg/dL (ref 0–99)
Triglycerides: 52 mg/dL (ref 0–149)
VLDL Cholesterol Cal: 12 mg/dL (ref 5–40)

## 2021-12-29 IMAGING — DX DG CHEST 2V
2 series · 2 of 2 positions shown · non-contrast
Comparison: 05/07/2020

CLINICAL DATA: Chest pain

EXAM:
CHEST - 2 VIEW

[w chest pa]
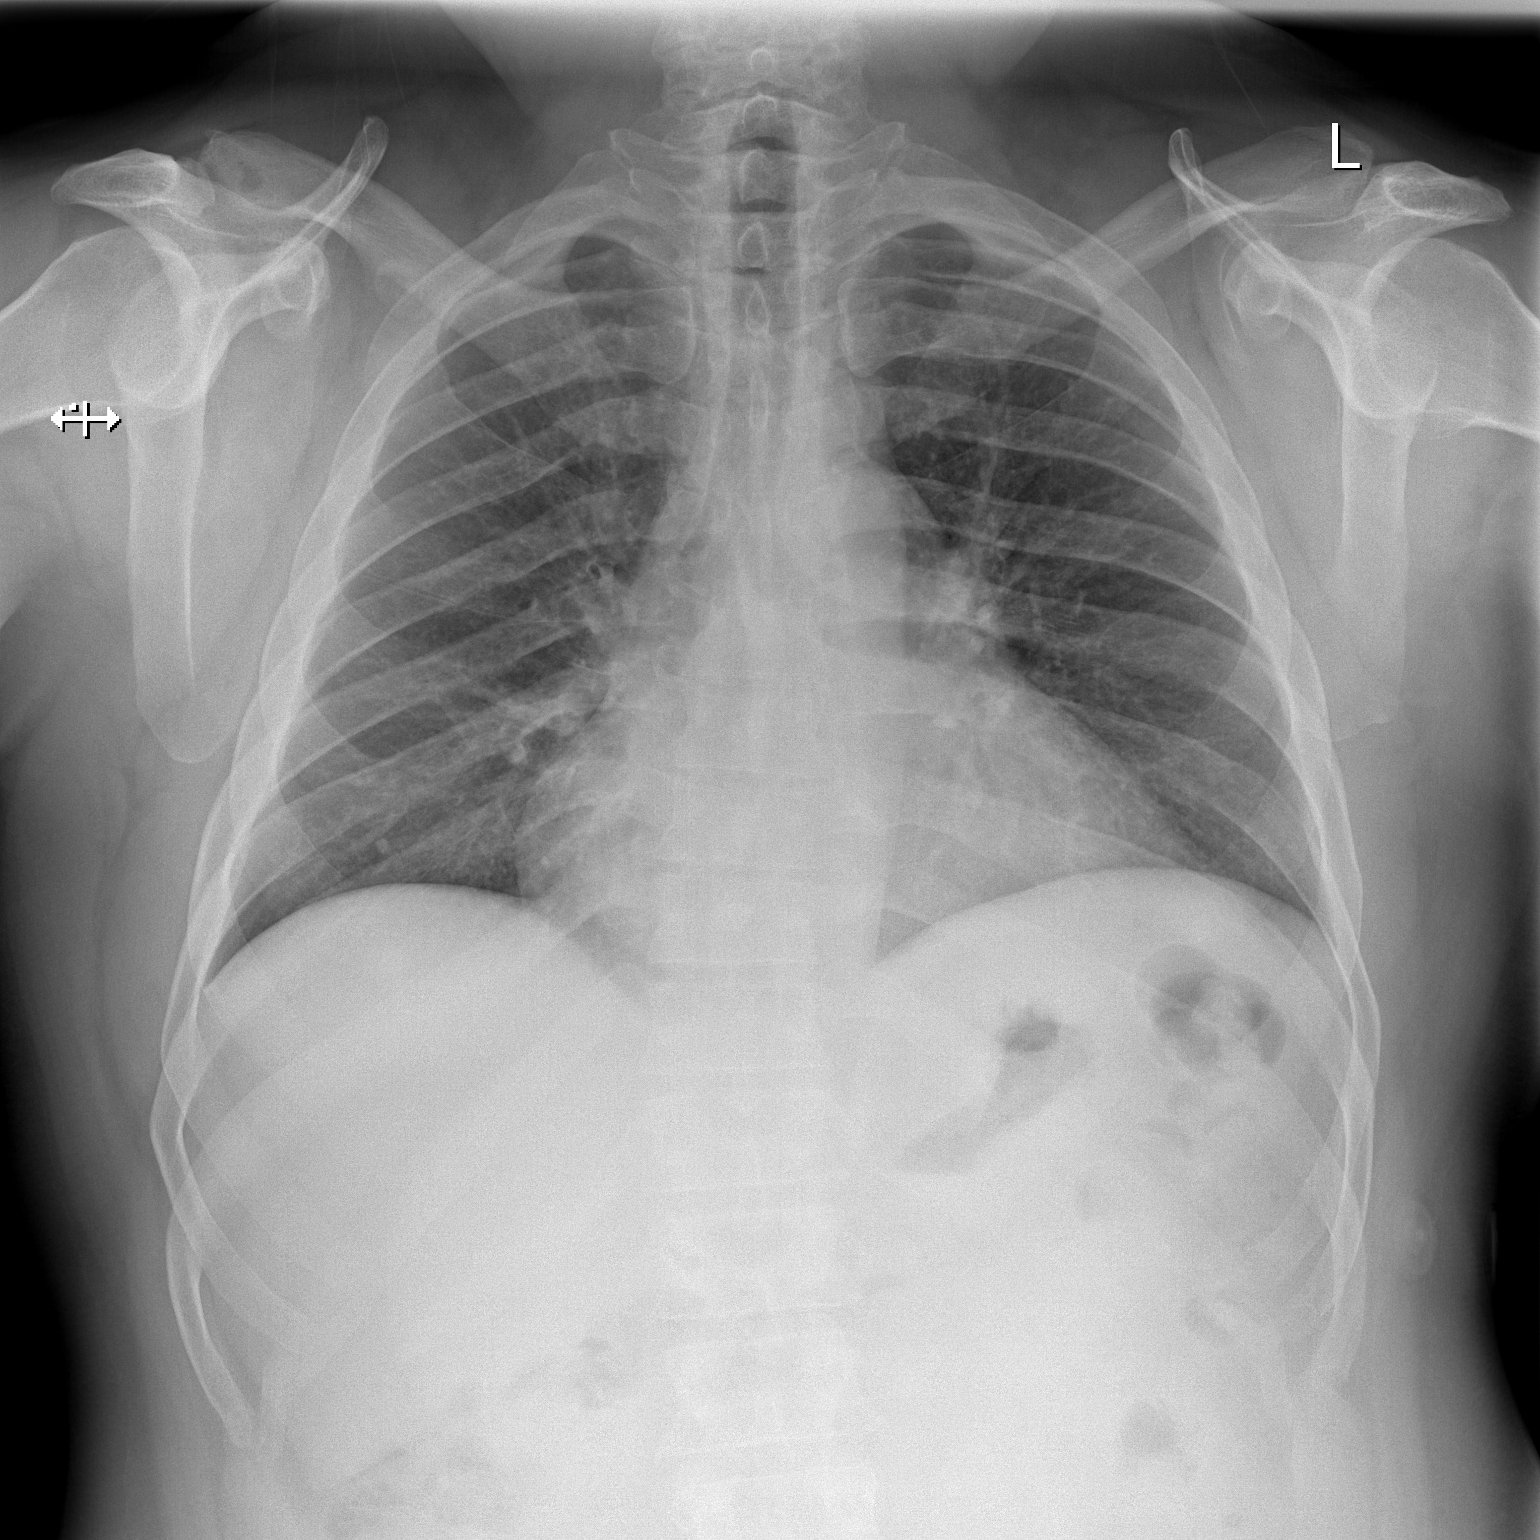

[w chest lat]
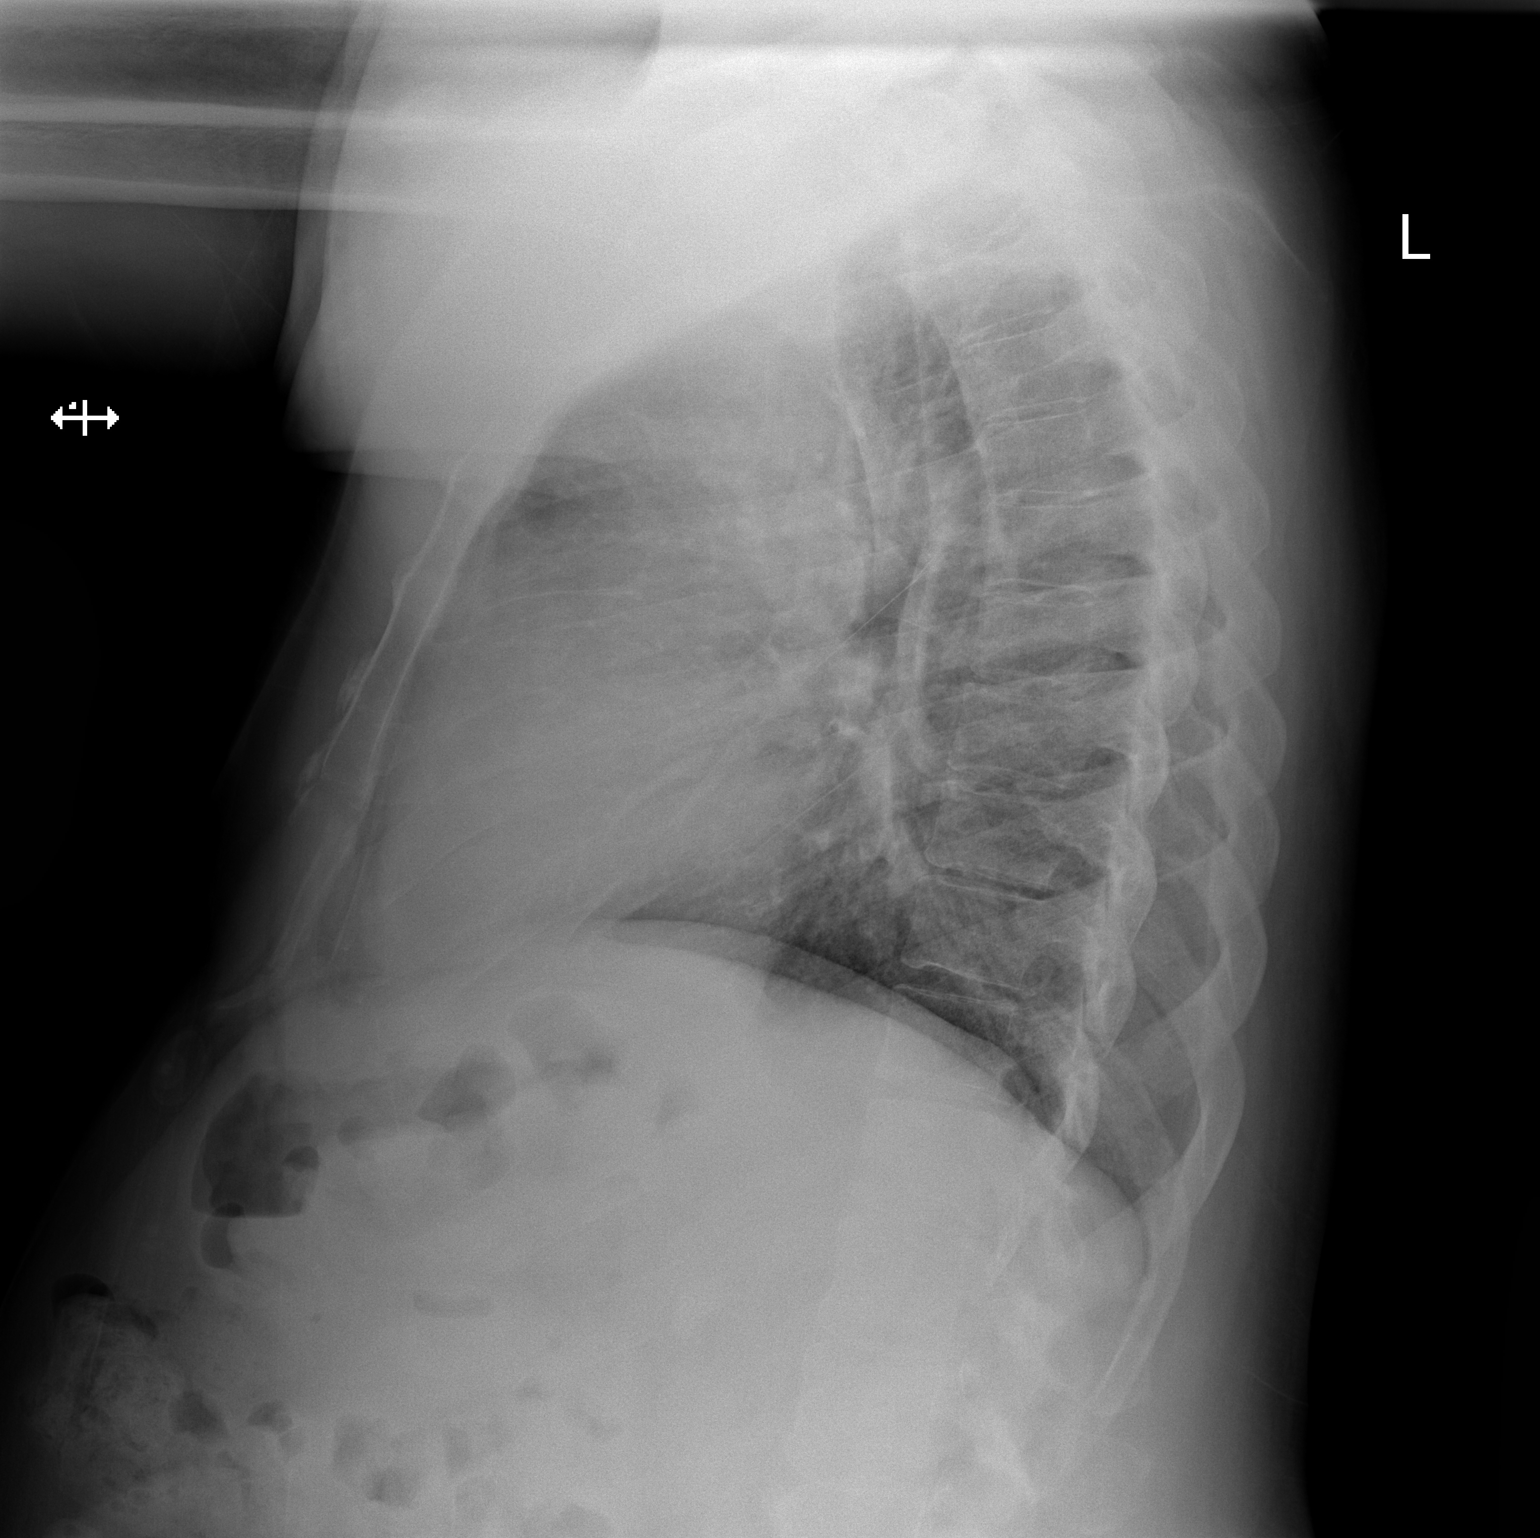

[2 of 2 positions shown; findings below may reference images not displayed]

FINDINGS: Mild cardiomegaly. No focal opacity, pleural effusion or
pneumothorax.
IMPRESSION: No active cardiopulmonary disease.

## 2022-04-08 ENCOUNTER — Other Ambulatory Visit: Payer: Self-pay

## 2022-04-08 DIAGNOSIS — G4733 Obstructive sleep apnea (adult) (pediatric): Secondary | ICD-10-CM

## 2022-04-08 DIAGNOSIS — I10 Essential (primary) hypertension: Secondary | ICD-10-CM

## 2022-04-08 DIAGNOSIS — Z720 Tobacco use: Secondary | ICD-10-CM

## 2022-04-08 DIAGNOSIS — I2510 Atherosclerotic heart disease of native coronary artery without angina pectoris: Secondary | ICD-10-CM

## 2022-04-08 DIAGNOSIS — E785 Hyperlipidemia, unspecified: Secondary | ICD-10-CM

## 2022-04-08 DIAGNOSIS — I214 Non-ST elevation (NSTEMI) myocardial infarction: Secondary | ICD-10-CM

## 2022-04-08 DIAGNOSIS — Z79899 Other long term (current) drug therapy: Secondary | ICD-10-CM

## 2022-04-08 MED ORDER — LOSARTAN POTASSIUM 25 MG PO TABS: 25.0000 mg | ORAL_TABLET | Freq: Every day | ORAL | 2 refills | Status: DC

## 2022-06-09 ENCOUNTER — Telehealth: Payer: Self-pay | Admitting: Cardiology

## 2022-06-09 ENCOUNTER — Encounter: Payer: Self-pay | Admitting: *Deleted

## 2022-06-09 DIAGNOSIS — I2510 Atherosclerotic heart disease of native coronary artery without angina pectoris: Secondary | ICD-10-CM

## 2022-06-09 DIAGNOSIS — I214 Non-ST elevation (NSTEMI) myocardial infarction: Secondary | ICD-10-CM

## 2022-06-09 DIAGNOSIS — Z024 Encounter for examination for driving license: Secondary | ICD-10-CM

## 2022-06-09 NOTE — Telephone Encounter (Signed)
Left the pt a message to call the office back. 

## 2022-06-09 NOTE — Telephone Encounter (Signed)
Jesse Wells, Jesse Wells - 06/09/2022  2:56 PM Jesse Sprague, MD  Sent: Wed Jun 09, 2022  3:27 PM  To: Loa Socks, LPN         Message  No problem. Can do lexi for him     Pt made aware that per Dr. Shari Prows, we will order for him to get a lexiscan done.  Pt aware that I will place the order in the system and send a message to our [facility] Scheduling team to call him back and arrange for this to be done at very next available appt.   Pt is aware that I will send him his lexiscan instructions to his mychart account to review and refer to, whenever his lexi is scheduled.   Will pend attestation order in this encounter and send to Dr. Shari Prows to sign off on.   Pt verbalized understanding and agrees with this plan.

## 2022-06-09 NOTE — Telephone Encounter (Signed)
Pt is calling in to let Dr. 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  Pt states he needs to get the stress test done asap, for his license will run out in the next couple days.   Pt is aware that I will route this message to Dr. Shari Prows to further review and advise on, and I will follow-up with him accordingly thereafter.   Pt is aware that we will provide him with the DOT clearance letter, once his stress test is complete and advised on.   Pt verbalized understanding and agrees with this plan.

## 2022-06-09 NOTE — Telephone Encounter (Signed)
Pt stated he needs a letter to keep working after his heart attack a few years ago for his DOT physical and a recent stress test. Pt would like a callback regarding this matter. Please advise

## 2022-06-10 ENCOUNTER — Telehealth (HOSPITAL_COMMUNITY): Payer: Self-pay | Admitting: *Deleted

## 2022-06-10 NOTE — Telephone Encounter (Signed)
Pt is scheduled for lexiscan myoview for 06/14/22 at 0745. Pt made aware of appt date and time by Nuclear Scheduler.

## 2022-06-10 NOTE — Telephone Encounter (Signed)
Left message on voicemail per DPR in reference to upcoming appointment scheduled on 06/14/2022 at 7:45 with detailed instructions given per Myocardial Perfusion Study Information Sheet for the test. LM to arrive 15 minutes early, and that it is imperative to arrive on time for appointment to keep from having the test rescheduled. If you need to cancel or reschedule your appointment, please call the office within 24 hours of your appointment. Failure to do so may result in a cancellation of your appointment, and a $50 no show fee. Phone number given for call back for any questions.

## 2022-06-14 ENCOUNTER — Ambulatory Visit (HOSPITAL_COMMUNITY): Payer: Self-pay | Attending: Cardiology

## 2022-06-14 DIAGNOSIS — I214 Non-ST elevation (NSTEMI) myocardial infarction: Secondary | ICD-10-CM

## 2022-06-14 DIAGNOSIS — I2510 Atherosclerotic heart disease of native coronary artery without angina pectoris: Secondary | ICD-10-CM

## 2022-06-14 DIAGNOSIS — Z024 Encounter for examination for driving license: Secondary | ICD-10-CM

## 2022-06-14 LAB — MYOCARDIAL PERFUSION IMAGING
Estimated workload: 1
Exercise duration (min): 1 min
Exercise duration (sec): 0 s
LV dias vol: 100 mL (ref 62–150)
LV sys vol: 48 mL
MPHR: 165 {beats}/min
Nuc Stress EF: 52 %
Peak HR: 95 {beats}/min
Percent HR: 57 %
Rest HR: 70 {beats}/min
Rest Nuclear Isotope Dose: 10.9 mCi
SDS: 0
SRS: 0
SSS: 0
ST Depression (mm): 0 mm
Stress Nuclear Isotope Dose: 30.2 mCi
TID: 0.97

## 2022-06-14 MED ORDER — TECHNETIUM TC 99M TETROFOSMIN IV KIT
10.9000 | PACK | Freq: Once | INTRAVENOUS | Status: AC | PRN
  Administered 2022-06-14: 10.9 via INTRAVENOUS

## 2022-06-14 MED ORDER — TECHNETIUM TC 99M TETROFOSMIN IV KIT
30.2000 | PACK | Freq: Once | INTRAVENOUS | Status: AC | PRN
  Administered 2022-06-14: 30.2 via INTRAVENOUS

## 2022-06-14 MED ORDER — REGADENOSON 0.4 MG/5ML IV SOLN
0.4000 mg | Freq: Once | INTRAVENOUS | Status: AC
  Administered 2022-06-14: 0.4 mg via INTRAVENOUS

## 2022-06-15 ENCOUNTER — Telehealth: Payer: Self-pay | Admitting: Cardiology

## 2022-06-15 ENCOUNTER — Encounter: Payer: Self-pay | Admitting: *Deleted

## 2022-06-15 NOTE — Telephone Encounter (Signed)
Patient is calling needing a letter releasing him back to work. Please advise.

## 2022-06-15 NOTE — Telephone Encounter (Signed)
Called the pt and informed him that Dr. Shari Prows wrote his DOT clearance letter, and we sent this to his mychart account.   Asked the pt if he would like for me to mail a hard copy, and he stated mychart was completely fine, and he will manage getting the letter from there.  Pt verbalized understanding and was gracious for all the assistance provided.

## 2022-06-15 NOTE — Telephone Encounter (Signed)
To Whom It May Concern,    The patient had reassuring cardiac stress testing on 06/15/22 with no further testing needed from a cardiovascular standpoint at this time. 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 If you have any questions or concerns, please do not hesitate to call our office at [phone].    Sincerely,    Laurance Flatten

## 2022-06-15 NOTE — Telephone Encounter (Signed)
Pt had a lexiscan done yesterday which came back within normal limits.  Pt is needing a short supporting letter written by Dr. Shari Prows, for DOT clearance.  Will route this message to Dr. Shari Prows to further advise on DOT clearance letter, and I will follow-up with the pt accordingly thereafter.

## 2022-07-12 ENCOUNTER — Other Ambulatory Visit: Payer: Self-pay | Admitting: *Deleted

## 2022-07-12 ENCOUNTER — Telehealth: Payer: Self-pay | Admitting: Cardiology

## 2022-07-12 ENCOUNTER — Ambulatory Visit: Payer: 59 | Admitting: Cardiology

## 2022-07-12 DIAGNOSIS — Z79899 Other long term (current) drug therapy: Secondary | ICD-10-CM

## 2022-07-12 DIAGNOSIS — E785 Hyperlipidemia, unspecified: Secondary | ICD-10-CM

## 2022-07-12 DIAGNOSIS — I2510 Atherosclerotic heart disease of native coronary artery without angina pectoris: Secondary | ICD-10-CM

## 2022-07-12 DIAGNOSIS — I10 Essential (primary) hypertension: Secondary | ICD-10-CM

## 2022-07-12 DIAGNOSIS — Z720 Tobacco use: Secondary | ICD-10-CM

## 2022-07-12 DIAGNOSIS — I214 Non-ST elevation (NSTEMI) myocardial infarction: Secondary | ICD-10-CM

## 2022-07-12 DIAGNOSIS — G4733 Obstructive sleep apnea (adult) (pediatric): Secondary | ICD-10-CM

## 2022-07-12 MED ORDER — ROSUVASTATIN CALCIUM 20 MG PO TABS
20.0000 mg | ORAL_TABLET | Freq: Every day | ORAL | 1 refills | Status: DC
Start: 2022-07-12 — End: 2023-01-06

## 2022-07-12 MED ORDER — METOPROLOL SUCCINATE ER 25 MG PO TB24
25.0000 mg | ORAL_TABLET | Freq: Every day | ORAL | 1 refills | Status: DC
Start: 2022-07-12 — End: 2023-01-06

## 2022-07-12 MED ORDER — EZETIMIBE 10 MG PO TABS
10.0000 mg | ORAL_TABLET | Freq: Every day | ORAL | 1 refills | Status: DC
Start: 2022-07-12 — End: 2023-01-06

## 2022-07-12 NOTE — Telephone Encounter (Signed)
*  STAT* If patient is at the pharmacy, call can be transferred to refill team.   1. Which medications need to be refilled? (please list name of each medication and dose if known)   rosuvastatin (CRESTOR) 20 MG tablet  metoprolol succinate (TOPROL XL) 25 MG 24 hr tablet  ezetimibe (ZETIA) 10 MG tablet   2. Which pharmacy/location (including street and city if local pharmacy) is medication to be sent to?  [facility] [address] [place], [place] - [place] [address]   3. Do they need a 30 day or 90 day supply?   90 day  Patient stated he still has some of these medications.  Patient has appointment scheduled on 8/2.

## 2022-08-19 NOTE — Progress Notes (Deleted)
  Cardiology Office Note:  .   Date:  08/19/2022  ID:  Jesse Wells, DOB 1965-02-25, MRN [id number] PCP: [facility]  [facility] Providers Cardiologist:  Armanda Magic, MD { Click to update primary MD,subspecialty MD or APP then REFRESH:1}   Patient Profile: .      PMH CAD  S/p PCI to LAD 05/29/2020 Hypertension Hyperlipidemia Tobacco abuse OSA on CPAP  He presented to [facility] 05/27/2020 with worsening chest pain concerning for unstable angina and was found to have severe proximal LAD stenosis successfully treated with PCI/DES on 05/28/2020.  TTE 05/28/2020 with LVEF 50 to 55%, normal diastology, trivial MR, no other significant valve disease.  He has been followed by Dr. Shari Prows since that time.  Last cardiology clinic visit was 12/28/2021 with Dr. Shari Prows.  He reported at that time that he had resumed smoking about 1/2 pack/day.  Tobacco cessation was discussed at length.  He reported compliance with CPAP and no concerns with medications.  2-month follow-up was recommended.  He underwent Steffanie Dunn for DOT physical on 06/14/22 which was low risk, no evidence of ischemia or infarction, normal LV function.         History of Present Illness: Jesse Wells   Jesse Wells is a *** 57 y.o. male who is here today  ROS: ***       Studies Reviewed: .        *** Risk Assessment/Calculations:   {Does this patient have ATRIAL FIBRILLATION?:[id number]} No BP recorded.  {Refresh Note OR Click here to enter BP  :1}***       Physical Exam:   VS:  There were no vitals taken for this visit.   Wt Readings from Last 3 Encounters:  06/14/22 222 lb (100.7 kg)  12/28/21 222 lb 3.2 oz (100.8 kg)  07/08/21 222 lb (100.7 kg)    GEN: Well nourished, well developed in no acute distress NECK: No JVD; No carotid bruits CARDIAC: ***RRR, no murmurs, rubs, gallops RESPIRATORY:  Clear to auscultation without rales, wheezing or rhonchi  ABDOMEN: Soft,  non-tender, non-distended EXTREMITIES:  No edema; No deformity     ASSESSMENT AND PLAN: .    CAD: Progressive angina with severe occlusion to prox LAD successfully treated with PCI/DES 05/28/2020  Hyperlipidemia: LDL 48 on 12/28/21  Hypertension:  OSA:     {Are you ordering a CV Procedure (e.g. stress test, cath, DCCV, TEE, etc)?   Press F2        :[id number]}  Dispo: ***  Signed, Eligha Bridegroom, NP-C

## 2022-08-27 ENCOUNTER — Ambulatory Visit: Payer: Self-pay | Admitting: Nurse Practitioner

## 2022-11-02 ENCOUNTER — Ambulatory Visit: Payer: Self-pay | Admitting: Nurse Practitioner

## 2023-01-05 NOTE — Progress Notes (Unsigned)
Cardiology Office Note:  .   Date:  01/06/2023  ID:  Jesse Wells, DOB February 18, 1965, MRN [id number] PCP: [facility]  [facility] Providers Cardiologist:  Jesse Magic, MD {  History of Present Illness: [name]   Jesse Wells is a 57 y.o. male with a past medical history of CAD status post PCI to LAD on 05/29/2020, HTN, HLD presents for follow-up appointment.  Patient presented to [facility] hospital 05/27/2020 with worsening chest pain concerning for unstable angina found to have severe proximal LAD stenosis status post PCI on 5//22.  TTE 5//22 with LVEF 50 to 55%, normal diastolic function, trivial MR, no other significant valvular disease.  Discharged on 05/28/2020.  Was seen in the clinic 06/2021 where he was doing very well.  Had started smoking again but working on cutting back.  He was seen by Dr. Shari Wells last year 12/23 and was accompanied by family member.  Overall he been doing well.  Tolerating medications including baby aspirin and metoprolol.  Stated that he restarted smoking again and smokes about half a pack a day.  He states his job has been stressful.  Working on cutting back.  Denied palpitations, chest pain, shortness of breath, peripheral edema.  No lightheadedness, headache, syncope, orthopnea, or PND.  Today, he presents with a history of sleep apnea and hypertension for a routine follow-up. He has been struggling with smoking cessation, having quit and restarted multiple times. Currently, he is smoking approximately five cigarettes a day, a significant reduction from his previous habit. He is also using a low-dose nicotine vape, which he does not inhale but blows out. He is open to resources.   He denies any swelling in his legs, shortness of breath, or chest pain. 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 He denies any daytime sleepiness. He has no trouble with his current medications, which include  losartan for hypertension. His blood pressure is well controlled at 128/60.  Reports no shortness of breath nor dyspnea on exertion. Reports no chest pain, pressure, or tightness. No edema, orthopnea, PND. Reports no palpitations.    Discussed the use of AI scribe software for clinical note transcription with the patient, who gave verbal consent to proceed.  ROS: Pertinent ROS in HPI  Studies Reviewed: [name]       Lexiscan Myoview 06/14/22 The study is normal. The study is low risk.   No ST deviation was noted.   LV perfusion is normal. There is no evidence of ischemia. There is no evidence of infarction.   Left ventricular function is normal. Nuclear stress EF: 52 %. The left ventricular ejection fraction is mildly decreased (45-54%). End diastolic cavity size is normal. End systolic cavity size is normal.   Normal stress nuclear study with no ischemia or infarction.  Gated ejection fraction 52% with normal wall motion      Physical Exam:   VS:  BP 128/60   Pulse (!) 57   Ht 5\' 7"  (1.702 m)   Wt 238 lb 12.8 oz (108.3 kg)   SpO2 97%   BMI 37.40 kg/m    Wt Readings from Last 3 Encounters:  01/06/23 238 lb 12.8 oz (108.3 kg)  06/14/22 222 lb (100.7 kg)  12/28/21 222 lb 3.2 oz (100.8 kg)    GEN: Well nourished, well developed in no acute distress NECK: No JVD; No carotid bruits CARDIAC: RRR, no murmurs, rubs, gallops RESPIRATORY:  Clear to  auscultation without rales, wheezing or rhonchi  ABDOMEN: Soft, non-tender, non-distended EXTREMITIES:  No edema; No deformity   ASSESSMENT AND PLAN: .   CAD/NSTEMI -No chest pain or shortness of breath -Continue current medication regimen including aspirin 81 mg daily, Zetia 10 mg daily, Cozaar 25 mg daily, metoprolol succinate 25 mg daily, Crestor 20 mg daily -Continue heart healthy, low-sodium diet  Tobacco Use Disorder   Current smoker, reduced to 5 cigarettes per day and using low-dose nicotine vape. Expressed interest in quitting.    -Consider referral to social worker, Jesse Wells, for additional support and resources.    Obstructive Sleep Apnea   Using CPAP device daily, no daytime sleepiness reported.   -Continue daily CPAP use.    Hypertension   Blood pressure well-controlled on current medication, Losartan.   -Continue Losartan as prescribed.    General Health Maintenance   -Order lipid panel and kidney function tests.   -Provide patient with lab order to complete at a convenient [facility] location near his home.      Dispo: He can follow-up in a year or sooner if needed  Signed, Jesse Dory, PA-C

## 2023-01-06 ENCOUNTER — Ambulatory Visit: Payer: Self-pay | Attending: Cardiology | Admitting: Physician Assistant

## 2023-01-06 ENCOUNTER — Encounter: Payer: Self-pay | Admitting: Physician Assistant

## 2023-01-06 VITALS — BP 128/60 | HR 57 | Ht 67.0 in | Wt 238.8 lb

## 2023-01-06 DIAGNOSIS — Z79899 Other long term (current) drug therapy: Secondary | ICD-10-CM

## 2023-01-06 DIAGNOSIS — I2510 Atherosclerotic heart disease of native coronary artery without angina pectoris: Secondary | ICD-10-CM

## 2023-01-06 DIAGNOSIS — E785 Hyperlipidemia, unspecified: Secondary | ICD-10-CM

## 2023-01-06 DIAGNOSIS — I214 Non-ST elevation (NSTEMI) myocardial infarction: Secondary | ICD-10-CM

## 2023-01-06 DIAGNOSIS — I10 Essential (primary) hypertension: Secondary | ICD-10-CM

## 2023-01-06 DIAGNOSIS — G4733 Obstructive sleep apnea (adult) (pediatric): Secondary | ICD-10-CM

## 2023-01-06 DIAGNOSIS — Z720 Tobacco use: Secondary | ICD-10-CM

## 2023-01-06 MED ORDER — LOSARTAN POTASSIUM 25 MG PO TABS: 25.0000 mg | ORAL_TABLET | Freq: Every day | ORAL | 3 refills | Status: DC

## 2023-01-06 MED ORDER — EZETIMIBE 10 MG PO TABS: 10.0000 mg | ORAL_TABLET | Freq: Every day | ORAL | 3 refills | Status: DC

## 2023-01-06 MED ORDER — METOPROLOL SUCCINATE ER 25 MG PO TB24: 25.0000 mg | ORAL_TABLET | Freq: Every day | ORAL | 3 refills | Status: DC

## 2023-01-06 MED ORDER — ROSUVASTATIN CALCIUM 20 MG PO TABS: 20.0000 mg | ORAL_TABLET | Freq: Every day | ORAL | 3 refills | Status: DC

## 2023-01-06 NOTE — Patient Instructions (Signed)
Medication Instructions:   Your physician recommends that you continue on your current medications as directed. Please refer to the Current Medication list given to you today.  *If you need a refill on your cardiac medications before your next appointment, please call your pharmacy*   Lab Work:  CMET AND LIPIDS AST YOUR NEAREST [facility]  If you have labs (blood work) drawn today and your tests are completely normal, you will receive your results only by: MyChart Message (if you have MyChart) OR A paper copy in the mail If you have any lab test that is abnormal or we need to change your treatment, we will call you to review the results.   Testing/Procedures: NONE ORDERED  TODAY     Follow-Up: At [facility], you and your health needs are our priority.  As part of our continuing mission to provide you with exceptional heart care, we have created designated Provider Care Teams.  These Care Teams include your primary Cardiologist (physician) and Advanced Practice Providers (APPs -  Physician Assistants and Nurse Practitioners) who all work together to provide you with the care you need, when you need it.  We recommend signing up for the patient portal called "MyChart".  Sign up information is provided on this After Visit Summary.  MyChart is used to connect with patients for Virtual Visits (Telemedicine).  Patients are able to view lab/test results, encounter notes, upcoming appointments, etc.  Non-urgent messages can be sent to your provider as well.   To learn more about what you can do with MyChart, go to [email or web address].    Your next appointment:   1 year(s)  Provider:  Jari Favre   Other Instructions

## 2023-03-23 ENCOUNTER — Other Ambulatory Visit: Payer: Self-pay

## 2024-01-02 ENCOUNTER — Other Ambulatory Visit: Payer: Self-pay | Admitting: Physician Assistant

## 2024-01-02 DIAGNOSIS — E785 Hyperlipidemia, unspecified: Secondary | ICD-10-CM

## 2024-01-02 DIAGNOSIS — Z79899 Other long term (current) drug therapy: Secondary | ICD-10-CM

## 2024-01-02 DIAGNOSIS — I2510 Atherosclerotic heart disease of native coronary artery without angina pectoris: Secondary | ICD-10-CM

## 2024-01-02 DIAGNOSIS — I10 Essential (primary) hypertension: Secondary | ICD-10-CM

## 2024-01-02 DIAGNOSIS — I214 Non-ST elevation (NSTEMI) myocardial infarction: Secondary | ICD-10-CM

## 2024-01-02 DIAGNOSIS — Z720 Tobacco use: Secondary | ICD-10-CM

## 2024-01-02 DIAGNOSIS — G4733 Obstructive sleep apnea (adult) (pediatric): Secondary | ICD-10-CM

## 2024-01-03 ENCOUNTER — Other Ambulatory Visit: Payer: Self-pay | Admitting: Physician Assistant

## 2024-01-03 DIAGNOSIS — G4733 Obstructive sleep apnea (adult) (pediatric): Secondary | ICD-10-CM

## 2024-01-03 DIAGNOSIS — I214 Non-ST elevation (NSTEMI) myocardial infarction: Secondary | ICD-10-CM

## 2024-01-03 DIAGNOSIS — I2510 Atherosclerotic heart disease of native coronary artery without angina pectoris: Secondary | ICD-10-CM

## 2024-01-03 DIAGNOSIS — E785 Hyperlipidemia, unspecified: Secondary | ICD-10-CM

## 2024-01-03 DIAGNOSIS — Z720 Tobacco use: Secondary | ICD-10-CM

## 2024-01-03 DIAGNOSIS — I10 Essential (primary) hypertension: Secondary | ICD-10-CM

## 2024-01-03 DIAGNOSIS — Z79899 Other long term (current) drug therapy: Secondary | ICD-10-CM

## 2024-01-31 ENCOUNTER — Other Ambulatory Visit: Payer: Self-pay | Admitting: Cardiology

## 2024-01-31 DIAGNOSIS — I2510 Atherosclerotic heart disease of native coronary artery without angina pectoris: Secondary | ICD-10-CM

## 2024-01-31 DIAGNOSIS — I10 Essential (primary) hypertension: Secondary | ICD-10-CM

## 2024-01-31 DIAGNOSIS — G4733 Obstructive sleep apnea (adult) (pediatric): Secondary | ICD-10-CM

## 2024-01-31 DIAGNOSIS — Z79899 Other long term (current) drug therapy: Secondary | ICD-10-CM

## 2024-01-31 DIAGNOSIS — Z720 Tobacco use: Secondary | ICD-10-CM

## 2024-01-31 DIAGNOSIS — I214 Non-ST elevation (NSTEMI) myocardial infarction: Secondary | ICD-10-CM

## 2024-01-31 DIAGNOSIS — E785 Hyperlipidemia, unspecified: Secondary | ICD-10-CM

## 2024-02-14 ENCOUNTER — Other Ambulatory Visit: Payer: Self-pay | Admitting: Cardiology

## 2024-02-14 DIAGNOSIS — Z720 Tobacco use: Secondary | ICD-10-CM

## 2024-02-14 DIAGNOSIS — I2510 Atherosclerotic heart disease of native coronary artery without angina pectoris: Secondary | ICD-10-CM

## 2024-02-14 DIAGNOSIS — I10 Essential (primary) hypertension: Secondary | ICD-10-CM

## 2024-02-14 DIAGNOSIS — I214 Non-ST elevation (NSTEMI) myocardial infarction: Secondary | ICD-10-CM

## 2024-02-14 DIAGNOSIS — G4733 Obstructive sleep apnea (adult) (pediatric): Secondary | ICD-10-CM

## 2024-02-14 DIAGNOSIS — E785 Hyperlipidemia, unspecified: Secondary | ICD-10-CM

## 2024-02-14 DIAGNOSIS — Z79899 Other long term (current) drug therapy: Secondary | ICD-10-CM
